# Patient Record
Sex: Female | Born: 1980 | Race: White | Hispanic: No | Marital: Single | Smoking: Never smoker
Health system: Southern US, Community
[De-identification: ages and names within clinical notes are randomized; demographics above are authoritative.]

## PROBLEM LIST (undated history)

## (undated) DIAGNOSIS — G43909 Migraine, unspecified, not intractable, without status migrainosus: Secondary | ICD-10-CM

## (undated) DIAGNOSIS — R5382 Chronic fatigue, unspecified: Secondary | ICD-10-CM

## (undated) DIAGNOSIS — C50919 Malignant neoplasm of unspecified site of unspecified female breast: Secondary | ICD-10-CM

## (undated) HISTORY — DX: Malignant neoplasm of unspecified site of unspecified female breast: C50.919

## (undated) HISTORY — DX: Chronic fatigue, unspecified: R53.82

## (undated) HISTORY — DX: Migraine, unspecified, not intractable, without status migrainosus: G43.909

---

## 2001-02-10 ENCOUNTER — Encounter: Admission: RE | Admit: 2001-02-10 | Discharge: 2001-02-10 | Payer: Self-pay | Admitting: Sports Medicine

## 2002-06-04 ENCOUNTER — Emergency Department (HOSPITAL_COMMUNITY): Admission: EM | Admit: 2002-06-04 | Discharge: 2002-06-04 | Payer: Self-pay | Admitting: Emergency Medicine

## 2002-12-01 ENCOUNTER — Encounter: Admission: RE | Admit: 2002-12-01 | Discharge: 2002-12-01 | Payer: Self-pay | Admitting: Sports Medicine

## 2003-05-13 ENCOUNTER — Encounter: Admission: RE | Admit: 2003-05-13 | Discharge: 2003-05-13 | Payer: Self-pay | Admitting: Obstetrics & Gynecology

## 2003-11-19 ENCOUNTER — Encounter: Admission: RE | Admit: 2003-11-19 | Discharge: 2003-11-19 | Payer: Self-pay | Admitting: Sports Medicine

## 2004-01-04 ENCOUNTER — Ambulatory Visit: Payer: Self-pay | Admitting: Internal Medicine

## 2006-08-20 ENCOUNTER — Other Ambulatory Visit: Admission: RE | Admit: 2006-08-20 | Discharge: 2006-08-20 | Payer: Self-pay | Admitting: Internal Medicine

## 2008-09-23 ENCOUNTER — Other Ambulatory Visit: Admission: RE | Admit: 2008-09-23 | Discharge: 2008-09-23 | Payer: Self-pay | Admitting: Internal Medicine

## 2009-02-05 HISTORY — PX: CLOSED REDUCTION CLAVICLE FRACTURE: SUR253

## 2009-05-18 ENCOUNTER — Emergency Department (HOSPITAL_COMMUNITY): Admission: EM | Admit: 2009-05-18 | Discharge: 2009-05-18 | Payer: Self-pay | Admitting: Emergency Medicine
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
## 2011-11-21 ENCOUNTER — Other Ambulatory Visit (HOSPITAL_COMMUNITY)
Admission: RE | Admit: 2011-11-21 | Discharge: 2011-11-21 | Disposition: A | Discharge status: Home / Self Care | Payer: BC Managed Care – PPO | Source: Ambulatory Visit | Attending: Internal Medicine | Admitting: Internal Medicine

## 2011-11-21 DIAGNOSIS — N760 Acute vaginitis: Secondary | ICD-10-CM | POA: Insufficient documentation

## 2011-11-21 DIAGNOSIS — Z1151 Encounter for screening for human papillomavirus (HPV): Secondary | ICD-10-CM | POA: Insufficient documentation

## 2011-11-21 DIAGNOSIS — Z01419 Encounter for gynecological examination (general) (routine) without abnormal findings: Secondary | ICD-10-CM | POA: Insufficient documentation

## 2013-01-19 ENCOUNTER — Ambulatory Visit (INDEPENDENT_AMBULATORY_CARE_PROVIDER_SITE_OTHER): Payer: BC Managed Care – PPO | Admitting: Physician Assistant

## 2013-01-19 ENCOUNTER — Encounter: Payer: Self-pay | Admitting: Physician Assistant

## 2013-01-19 ENCOUNTER — Other Ambulatory Visit: Payer: Self-pay | Admitting: Physician Assistant

## 2013-01-19 VITALS — BP 102/68 | HR 88 | Temp 100.0°F | Resp 16 | Ht 65.0 in | Wt 123.0 lb

## 2013-01-19 DIAGNOSIS — J019 Acute sinusitis, unspecified: Secondary | ICD-10-CM

## 2013-01-19 DIAGNOSIS — J301 Allergic rhinitis due to pollen: Secondary | ICD-10-CM

## 2013-01-19 MED ORDER — PREDNISONE 20 MG PO TABS: ORAL_TABLET | ORAL | Status: DC

## 2013-01-19 MED ORDER — AZITHROMYCIN 250 MG PO TABS: ORAL_TABLET | ORAL | Status: DC

## 2013-01-19 NOTE — Progress Notes (Signed)
   Subjective:    Patient ID: Ashlee Pena, female    DOB: Oct 14, 1980, 32 y.o.   MRN: [id number]  URI  This is a new problem. Episode onset: 2 weeks. The problem has been gradually worsening. The maximum temperature recorded prior to her arrival was 100 - 100.9 F. The fever has been present for less than 1 day. Associated symptoms include congestion, coughing, rhinorrhea and a sore throat. Pertinent negatives include no ear pain, sneezing or wheezing. She has tried antihistamine, decongestant, NSAIDs and sleep for the symptoms. The treatment provided mild relief.  Patient states she gets something like this twice yearly with change in the seasons, we have discussed allergies and allergy testing in the past.   Review of Systems  Constitutional: Positive for fever and fatigue. Negative for chills.  HENT: Positive for congestion, postnasal drip, rhinorrhea, sinus pressure and sore throat. Negative for ear discharge, ear pain, sneezing, tinnitus, trouble swallowing and voice change.   Eyes: Negative.   Respiratory: Positive for cough. Negative for shortness of breath, wheezing and stridor.   Cardiovascular: Negative.   Gastrointestinal: Negative.   Musculoskeletal: Negative.       Objective:   Physical Exam  Constitutional: She appears well-developed and well-nourished.  HENT:  Head: Normocephalic and atraumatic.  Right Ear: External ear normal.  Left Ear: External ear normal.  Nose: Right sinus exhibits frontal sinus tenderness. Left sinus exhibits frontal sinus tenderness.  Eyes: Conjunctivae and EOM are normal.  Neck: Normal range of motion. Neck supple.  Cardiovascular: Normal rate, regular rhythm, normal heart sounds and intact distal pulses.   Pulmonary/Chest: Effort normal and breath sounds normal. No respiratory distress. She has no wheezes.  Abdominal: Soft. Bowel sounds are normal.  Lymphadenopathy:    She has no cervical adenopathy.  Skin: Skin is warm and dry.       Assessment & Plan:  Acute sinusitis, unspecified - Plan: azithromycin (ZITHROMAX) 250 MG tablet, predniSONE (DELTASONE) 20 MG tablet Hold onto Zpak for now and Cold hand out given.  Allergic rhinitis due to pollen - Plan: Allergen food profile specific IgE [phone])

## 2013-01-19 NOTE — Patient Instructions (Signed)

## 2013-01-20 LAB — ALLERGEN FOOD PROFILE SPECIFIC IGE
Apple: <0.1 kU/L
Chicken IgE: <0.1 kU/L
Egg White IgE: <0.1 kU/L
Orange: <0.1 kU/L
Shrimp IgE: 0.64 kU/L — ABNORMAL HIGH
Tomato IgE: <0.1 kU/L
Wheat IgE: <0.1 kU/L

## 2013-01-21 LAB — ALLERGY PROFILE REGION II-DC, DE, MD, ~~LOC~~, VA
Allergen, D pternoyssinus,d7: 22.2 kU/L — ABNORMAL HIGH
Bermuda Grass: 0.52 kU/L — ABNORMAL HIGH
Box Elder IgE: <0.1 kU/L
Cladosporium Herbarum: <0.1 kU/L
Cockroach: 0.25 kU/L — ABNORMAL HIGH
Common Ragweed: 0.59 kU/L — ABNORMAL HIGH
IgE (Immunoglobulin E), Serum: 149.9 IU/mL (ref 0.0–180.0)
Johnson Grass: <0.1 kU/L
Lamb's Quarters: 0.11 kU/L — ABNORMAL HIGH
Meadow Grass: <0.1 kU/L
Pecan/Hickory Tree IgE: 1.54 kU/L — ABNORMAL HIGH

## 2013-12-07 ENCOUNTER — Ambulatory Visit (INDEPENDENT_AMBULATORY_CARE_PROVIDER_SITE_OTHER): Payer: BC Managed Care – PPO | Admitting: Physician Assistant

## 2013-12-07 ENCOUNTER — Encounter: Payer: Self-pay | Admitting: Physician Assistant

## 2013-12-07 VITALS — BP 110/78 | HR 68 | Temp 98.4°F | Resp 16 | Ht 65.0 in | Wt 126.0 lb

## 2013-12-07 DIAGNOSIS — Z0001 Encounter for general adult medical examination with abnormal findings: Secondary | ICD-10-CM

## 2013-12-07 DIAGNOSIS — R6889 Other general symptoms and signs: Secondary | ICD-10-CM

## 2013-12-07 DIAGNOSIS — Z79899 Other long term (current) drug therapy: Secondary | ICD-10-CM

## 2013-12-07 DIAGNOSIS — E559 Vitamin D deficiency, unspecified: Secondary | ICD-10-CM

## 2013-12-07 LAB — CBC WITH DIFFERENTIAL/PLATELET
Basophils Absolute: 0 10*3/uL (ref 0.0–0.1)
Basophils Relative: 0 % (ref 0–1)
EOS ABS: 0.1 10*3/uL (ref 0.0–0.7)
Eosinophils Relative: 1 % (ref 0–5)
HEMATOCRIT: 41.7 % (ref 36.0–46.0)
Hemoglobin: 14.7 g/dL (ref 12.0–15.0)
Lymphocytes Relative: 29 % (ref 12–46)
Lymphs Abs: 1.9 10*3/uL (ref 0.7–4.0)
MCH: 31.9 pg (ref 26.0–34.0)
MCHC: 35.3 g/dL (ref 30.0–36.0)
MCV: 90.5 fL (ref 78.0–100.0)
MONO ABS: 0.5 10*3/uL (ref 0.1–1.0)
MONOS PCT: 7 % (ref 3–12)
NEUTROS ABS: 4.2 10*3/uL (ref 1.7–7.7)
Neutrophils Relative %: 63 % (ref 43–77)
Platelets: 170 10*3/uL (ref 150–400)
RBC: 4.61 MIL/uL (ref 3.87–5.11)
RDW: 14.2 % (ref 11.5–15.5)
WBC: 6.6 10*3/uL (ref 4.0–10.5)

## 2013-12-07 MED ORDER — EPINEPHRINE 0.3 MG/0.3ML IJ SOAJ: 0.3000 mg | INTRAMUSCULAR | Status: DC | PRN

## 2013-12-07 NOTE — Patient Instructions (Signed)
Keeping You Healthy  Please get on 5000 IU vitamin D  Get These Tests 1. Blood Pressure- Have your blood pressure checked once a year by your health care provider.  Normal blood pressure is 120/80. 2. Weight- Have your body mass index (BMI) calculated to screen for obesity.  BMI is measure of body fat based on height and weight.  You can also calculate your own BMI at https://www.west-esparza.com/www.nhlbisupport.com/bmi/. 3. Cholesterol- Have your cholesterol checked every 5 years starting at age 33 then yearly starting at age 33. 4. Chlamydia, HIV, and other sexually transmitted diseases- Get screened every year until age 33, then within three months of each new sexual provider. 5. Pap Smear- Every 1-3 years; discuss with your health care provider. 6. Mammogram- Every year starting at age 33  Take these medicines  Calcium with Vitamin D-Your body needs 1200 mg of Calcium each day and [phone] IU of Vitamin D daily.  Your body can only absorb 500 mg of Calcium at a time so Calcium must be taken in 2 or 3 divided doses throughout the day.  Multivitamin with folic acid- Once daily if it is possible for you to become pregnant.  Get these Immunizations  Gardasil-Series of three doses; prevents HPV related illness such as genital warts and cervical cancer.  Menactra-Single dose; prevents meningitis.  Tetanus shot- Every 10 years.  Flu shot-Every year.  Take these steps 1. Do not smoke-Your healthcare provider can help you quit.  For tips on how to quit go to www.smokefree.gov or call 1-800 QUITNOW. 2. Be physically active- Exercise 5 days a week for at least 30 minutes.  If you are not already physically active, start slow and gradually work up to 30 minutes of moderate physical activity.  Examples of moderate activity include walking briskly, dancing, swimming, bicycling, etc. 3. Breast Cancer- A self breast exam every month is important for early detection of breast cancer.  For more information and instruction on  self breast exams, ask your healthcare provider or [place].eswww.womenshealth.gov/faq/breast-self-exam.cfm. 4. Eat a healthy diet- Eat a variety of healthy foods such as fruits, vegetables, whole grains, low fat milk, low fat cheeses, yogurt, lean meats, poultry and fish, beans, nuts, tofu, etc.  For more information go to www. Thenutritionsource.org 5. Drink alcohol in moderation- Limit alcohol intake to one drink or less per day. Never drink and drive. 6. Depression- Your emotional health is as important as your physical health.  If you're feeling down or losing interest in things you normally enjoy please talk to your healthcare provider about being screened for depression. 7. Dental visit- Brush and floss your teeth twice daily; visit your dentist twice a year. 8. Eye doctor- Get an eye exam at least every 2 years. 9. Helmet use- Always wear a helmet when riding a bicycle, motorcycle, rollerblading or skateboarding. 10. Safe sex- If you may be exposed to sexually transmitted infections, use a condom. 11. Seat belts- Seat belts can save your live; always wear one. 12. Smoke/Carbon Monoxide detectors- These detectors need to be installed on the appropriate level of your home. Replace batteries at least once a year. 13. Skin cancer- When out in the sun please cover up and use sunscreen 15 SPF or higher. 14. Violence- If anyone is threatening or hurting you, please tell your healthcare provider.

## 2013-12-07 NOTE — Addendum Note (Signed)
Addended by: Quentin MullingOLLIER, Romelo Sciandra R on: 12/07/2013 03:38 PM   Modules accepted: Kipp BroodSmartSet

## 2013-12-07 NOTE — Progress Notes (Signed)
Complete Physical  Assessment and Plan: Allergies- Epipen Myalgias- check labs Vitamin D def- get on 5000 IU daily.  Health Maintenance  Discussed med's effects and SE's. Screening labs and tests as requested with regular follow-up as recommended.  HPI  33 y.o. female  presents for a complete physical. Her blood pressure has been controlled at home, today their BP is BP: 110/78 mmHg She does workout. She denies chest pain, shortness of breath, dizziness.  She is not on cholesterol medication and denies myalgias. Her cholesterol is at goal. The cholesterol last visit was:   Last A1C in the office was:  Patient is not on Vitamin D supplement, Vit D 36.   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 She is also working at [employer], broke up with boyfriend too. She has been very tired but states it is due to activity, started on magnesium which she thinks has helped. Last Mag 1.9 She is not on Nuvaring but not sexually active.  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   Current Medications:    Medication List       This list is accurate as of: 12/07/13  2:23 PM.  Always use your most recent med list.               ADVIL 200 MG Caps  Generic drug:  Ibuprofen  Take 200 mg by mouth as needed.     EPIPEN 2-PAK 0.3 mg/0.3 mL Soaj injection  Generic drug:  EPINEPHrine  Inject 0.3 mg into the muscle as needed.     etonogestrel-ethinyl estradiol 0.12-0.015 MG/24HR vaginal ring  Commonly known as:  NUVARING  Place 1 each vaginally every 28 (twenty-eight) days. Insert vaginally and leave in place for 3 consecutive weeks, then remove for 1 week.     ZYRTEC ALLERGY PO  Take by mouth as needed.       Health Maintenance:   Immunization History  Administered Date(s) Administered  . HPV Quadrivalent 07/19/2005, 10/09/2005, 01/17/2006  . Tdap 09/17/2007   Tetanus:2009 Pneumovax: Flu vaccine:  declines Gardasil X 3 last one 2007 Zostavax: LMP: Oct 20th 2015, last 9 days, spotting in the beginning and then heavy Pap: normal PAP 2013 due 2016 MGM:  DEXA: Colonoscopy: EGD:  Patient Care Team: Lucky CowboyWilliam McKeown, MD as PCP - General (Internal Medicine)  Allergies: No Known Allergies Medical History: No past medical history on file. Surgical History:  Past Surgical History  Procedure Laterality Date  . Closed reduction clavicle fracture Right 2011   Family History:  Family History  Problem Relation Age of Onset  . Cancer Father     skin  . Hypertension Other   . Stroke Other   . Cancer Other     skin  . Heart attack Other    Social History:  History  Substance Use Topics  . Smoking status: Never Smoker   . Smokeless tobacco: Never Used  . Alcohol Use: Yes     Comment: Occasionally   Review of Systems: [X]  = complains of  [ ]  = denies  General: Fatigue [ ]  Fever [ ]  Chills [ ]  Weakness [ ]   Insomnia [ ] Weight change [ ]  Night sweats [ ]   Change in appetite [ ]  Head: Head Trauma [ ]  Eyes: Wears glasses or corrective lens [ ]  Redness [ ]  Blurred vision [ ]  Diplopia [ ]  Discharge [ ]  Floaters [ ]   QIO:NGEXBMWENT:Earache [ ]  hearing loss [ ]  Tinnitus [ ]  Ear Discharge [ ]   Congestion [ ]  Sinus Pain [ ]  Post Nasal Drip [ ]  Nose Bleeds [ ]  Rhinorrhea [ ]    Difficulty Swallowing [ ]  Snoring [ ]  Sore Throat [ ]  Cardiac:   Chest pain/pressure [ ]  SOB [ ]  Orthopnea [ ]   Palpitations [ ]   Paroxysmal nocturnal dyspnea[ ]  Claudication [ ]  Edema [ ]  Difficulty walking around block or climbing stairs [ ]  Pulmonary: Cough [ ]  Wheezing[ ]   SOB [ ]   Pleurisy [ ]  Asthma [ ]  GI: Nausea [ ]  Vomiting[ ]  Dysphagia[ ]  Heartburn[ ]  Abdominal pain [ ]  Constipation [ ] ; Diarrhea [ ]  BRBPR [ ]  Melena[ ]  Bloating [ ]  Hemorrhoids [ ]  Incontinence [ ]  GU: Hematuria[ ]  Dysuria [ ]  Nocturia[ ]  Urgency [ ]   Hesitancy [ ]  Discharge [ ]  Frequency [ ]  Incontinence [ ]  Breast:  Dimpling [ ]  Breast lumps [ ]    Breast Lesions [ ]  Nipple discharge [ ]    Neuro: Headaches[ ]  Vertigo[ ]  Paresthesias[ ]  Spasm [ ]  Speech changes [ ]  Incoordination [ ]  Dizziness [ ]  Numbness [ ]  Ortho: Arthritis [ ]  Joint pain [ ]  Muscle pain [ ]  Joint swelling [ ]  Back Pain [ ]  Weakness [ ]  Stiffness [ ]  Skin:  Rash [ ]   Pruritis [ ]  Change in skin lesion [ ]  Change in hair [ ]  Change in nails [ ]  Psych: Depression[ ]  Anxiety[ ]  Stress [ ]  Confusion [ ]  Memory loss [ ]   Heme/Lymph: Bleeding [ ]  Bruising [ ]  History of anemia [ ]  Enlarged lymph nodes [ ]   Endocrine: Visual blurring [ ]  Paresthesia [ ]  Polyuria [ ]  Polydipsia [ ]  Polyphagia [ ]   Heat/cold intolerance [ ]  Hypoglycemia [ ]  Thyroid Issues [ ]  Diabetes [ ]   Physical Exam: Estimated body mass index is 20.97 kg/(m^2) as calculated from the following:   Height as of this encounter: 5\' 5"  (1.651 m).   Weight as of this encounter: 126 lb (57.153 kg). BP 110/78 mmHg  Pulse 68  Temp(Src) 98.4 F (36.9 C)  Resp 16  Ht 5\' 5"  (1.651 m)  Wt 126 lb (57.153 kg)  BMI 20.97 kg/m2 General Appearance: Well nourished, in no apparent distress.  Eyes: PERRLA, EOMs, conjunctiva no swelling or erythema, normal fundi and vessels.  Sinuses: No Frontal/maxillary tenderness  ENT/Mouth: Ext aud canals clear, normal light reflex with TMs without erythema, bulging. Good dentition. No erythema, swelling, or exudate on post pharynx. Tonsils not swollen or erythematous. Hearing normal.  Neck: Supple, thyroid normal. No bruits  Respiratory: Respiratory effort normal, BS equal bilaterally without rales, rhonchi, wheezing or stridor.  Cardio: RRR without murmurs, rubs or gallops. Brisk peripheral pulses without edema.  Chest: symmetric, with normal excursions and percussion.  Breasts: Symmetric, without lumps, nipple discharge, retractions.  Abdomen: Soft, nontender, no guarding, rebound, hernias, masses, or organomegaly. .  Lymphatics: Non tender without lymphadenopathy.  Genitourinary:   Musculoskeletal: Full ROM all peripheral extremities,5/5 strength, and normal gait.  Skin: Warm, dry without rashes, lesions, ecchymosis. Neuro: Cranial nerves intact, reflexes equal bilaterally. Normal muscle tone, no cerebellar symptoms. Sensation intact.  Psych: Awake and oriented X 3, normal affect, Insight and Judgment appropriate.   EKG: defer   Quentin MullingCollier, Amanda 2:13 PM [facility]

## 2013-12-08 LAB — IRON AND TIBC
%SAT: 58 % — ABNORMAL HIGH (ref 20–55)
Iron: 162 ug/dL — ABNORMAL HIGH (ref 42–145)
TIBC: 281 ug/dL (ref 250–470)
UIBC: 119 ug/dL — AB (ref 125–400)

## 2013-12-08 LAB — BASIC METABOLIC PANEL WITH GFR
BUN: 8 mg/dL (ref 6–23)
CO2: 27 mEq/L (ref 19–32)
Calcium: 9.2 mg/dL (ref 8.4–10.5)
Chloride: 104 mEq/L (ref 96–112)
Creat: 0.67 mg/dL (ref 0.50–1.10)
GFR, Est African American: >89 mL/min
GFR, Est Non African American: >89 mL/min
Glucose, Bld: 75 mg/dL (ref 70–99)
POTASSIUM: 3.7 meq/L (ref 3.5–5.3)
Sodium: 140 mEq/L (ref 135–145)

## 2013-12-08 LAB — HEPATIC FUNCTION PANEL
ALT: 10 U/L (ref 0–35)
AST: 16 U/L (ref 0–37)
Albumin: 4.8 g/dL (ref 3.5–5.2)
Alkaline Phosphatase: 50 U/L (ref 39–117)
BILIRUBIN INDIRECT: 0.4 mg/dL (ref 0.2–1.2)
Bilirubin, Direct: 0.1 mg/dL (ref 0.0–0.3)
Total Bilirubin: 0.5 mg/dL (ref 0.2–1.2)
Total Protein: 6.7 g/dL (ref 6.0–8.3)

## 2013-12-08 LAB — URINALYSIS, MICROSCOPIC ONLY
BACTERIA UA: NONE SEEN
CASTS: NONE SEEN
Crystals: NONE SEEN
Squamous Epithelial / LPF: NONE SEEN

## 2013-12-08 LAB — HEMOGLOBIN A1C
HEMOGLOBIN A1C: 5.5 % (ref <5.7)
Mean Plasma Glucose: 111 mg/dL (ref <117)

## 2013-12-08 LAB — URINALYSIS, ROUTINE W REFLEX MICROSCOPIC
Bilirubin Urine: NEGATIVE
Glucose, UA: NEGATIVE mg/dL
Hgb urine dipstick: NEGATIVE
Nitrite: NEGATIVE
Protein, ur: NEGATIVE mg/dL
Specific Gravity, Urine: <1.005 — ABNORMAL LOW (ref 1.005–1.030)
Urobilinogen, UA: 0.2 mg/dL (ref 0.0–1.0)
pH: 6 (ref 5.0–8.0)

## 2013-12-08 LAB — LIPID PANEL
CHOLESTEROL: 135 mg/dL (ref 0–200)
HDL: 63 mg/dL (ref >39)
LDL Cholesterol: 60 mg/dL (ref 0–99)
Total CHOL/HDL Ratio: 2.1 Ratio
Triglycerides: 60 mg/dL (ref <150)
VLDL: 12 mg/dL (ref 0–40)

## 2013-12-08 LAB — FERRITIN: Ferritin: 36 ng/mL (ref 10–291)

## 2013-12-08 LAB — MICROALBUMIN / CREATININE URINE RATIO
Creatinine, Urine: 28.1 mg/dL
Microalb Creat Ratio: 14.2 mg/g (ref 0.0–30.0)
Microalb, Ur: 0.4 mg/dL (ref <2.0)

## 2013-12-08 LAB — MAGNESIUM: MAGNESIUM: 1.9 mg/dL (ref 1.5–2.5)

## 2013-12-08 LAB — INSULIN, FASTING: Insulin fasting, serum: 9.1 u[IU]/mL (ref 2.0–19.6)

## 2013-12-08 LAB — VITAMIN B12: VITAMIN B 12: 379 pg/mL (ref 211–911)

## 2013-12-08 LAB — TSH: TSH: 1.466 u[IU]/mL (ref 0.350–4.500)

## 2013-12-08 LAB — VITAMIN D 25 HYDROXY (VIT D DEFICIENCY, FRACTURES): VIT D 25 HYDROXY: 27 ng/mL — AB (ref 30–89)

## 2014-05-12 ENCOUNTER — Ambulatory Visit (INDEPENDENT_AMBULATORY_CARE_PROVIDER_SITE_OTHER): Payer: Managed Care, Other (non HMO) | Admitting: Internal Medicine

## 2014-05-12 ENCOUNTER — Encounter: Payer: Self-pay | Admitting: Internal Medicine

## 2014-05-12 VITALS — BP 104/78 | HR 64 | Temp 98.6°F | Resp 18 | Ht 65.0 in | Wt 123.0 lb

## 2014-05-12 DIAGNOSIS — Z113 Encounter for screening for infections with a predominantly sexual mode of transmission: Secondary | ICD-10-CM

## 2014-05-12 DIAGNOSIS — R300 Dysuria: Secondary | ICD-10-CM

## 2014-05-12 MED ORDER — CEPHALEXIN 500 MG PO CAPS: 500.0000 mg | ORAL_CAPSULE | Freq: Three times a day (TID) | ORAL | Status: AC

## 2014-05-12 MED ORDER — PHENAZOPYRIDINE HCL 97.2 MG PO TABS: 97.0000 mg | ORAL_TABLET | Freq: Three times a day (TID) | ORAL | Status: DC | PRN

## 2014-05-12 NOTE — Patient Instructions (Signed)

## 2014-05-12 NOTE — Progress Notes (Signed)
Subjective:    Patient ID: Ashlee MorasAlisha Speir, female    DOB: 10-05-1980, 34 y.o.   MRN: [id number]  Dysuria  Associated symptoms include frequency and urgency. Pertinent negatives include no chills, hematuria, nausea or vomiting.   Patient presents to the office for evaluation of dysuria.  She reports that for the past 2 weeks she has had urinary frequency and dysuria since last Tuesday.  She reports no vaginal discharge.  She reports that she did have unprotected sex 2 weeks ago and she is concerned that she may have been exposed to something.  She reports that she just finished her period.  Period was normal.  She has no history of STD in the past.  She is not on birth control at this time.  She has no history of frequent UTI in the past.     Review of Systems  Constitutional: Negative for fever, chills and fatigue.  Gastrointestinal: Negative for nausea, vomiting, abdominal pain, diarrhea and constipation.  Genitourinary: Positive for dysuria, urgency and frequency. Negative for hematuria, vaginal bleeding, vaginal discharge and difficulty urinating.       Objective:   Physical Exam  Constitutional: She is oriented to person, place, and time. She appears well-developed and well-nourished. No distress.  HENT:  Head: Normocephalic and atraumatic.  Mouth/Throat: Oropharynx is clear and moist. No oropharyngeal exudate.  Eyes: Conjunctivae are normal. Pupils are equal, round, and reactive to light.  Neck: Normal range of motion. Neck supple. No JVD present. No thyromegaly present.  Cardiovascular: Normal rate, regular rhythm, normal heart sounds and intact distal pulses.  Exam reveals no gallop and no friction rub.   No murmur heard. Pulmonary/Chest: Effort normal and breath sounds normal. No respiratory distress. She has no wheezes. She has no rales. She exhibits no tenderness.  Abdominal: Soft. Bowel sounds are normal. She exhibits no distension and no mass. There is no tenderness.  There is no rebound and no guarding.  Genitourinary: No labial fusion. There is no rash, tenderness, lesion or injury on the right labia. There is no rash, tenderness, lesion or injury on the left labia. Cervix exhibits no motion tenderness, no discharge and no friability. Right adnexum displays no mass, no tenderness and no fullness. Left adnexum displays no mass, no tenderness and no fullness. No erythema, tenderness or bleeding in the vagina. No foreign body around the vagina. No signs of injury around the vagina. No vaginal discharge found.  Musculoskeletal: Normal range of motion.  Lymphadenopathy:    She has no cervical adenopathy.  Neurological: She is alert and oriented to person, place, and time.  Skin: Skin is warm and dry. She is not diaphoretic.  Psychiatric: She has a normal mood and affect. Her behavior is normal. Judgment and thought content normal.  Nursing note and vitals reviewed.         Assessment & Plan:  No CMT doubt PID.  Will wait for GC culture to determine treatment for STD.    1. Screening for STD (sexually transmitted disease)  - RPR - HIV antibody - WET PREP BY MOLECULAR PROBE - HSV(herpes simplex vrs) 1+2 ab-IgG - GC/Chlamydia Probe Amp  2. Dysuria  - Urinalysis, Routine w reflex microscopic - Urine culture - cephALEXin (KEFLEX) 500 MG capsule; Take 1 capsule (500 mg total) by mouth 3 (three) times daily.  Dispense: 21 capsule; Refill: 0 - phenazopyridine (PYRIDIUM) 97 MG tablet; Take 1 tablet (97 mg total) by mouth 3 (three) times daily as needed for pain.  Dispense: 20 tablet; Refill: 0

## 2014-05-13 ENCOUNTER — Ambulatory Visit: Payer: Self-pay | Admitting: Internal Medicine

## 2014-05-13 LAB — WET PREP BY MOLECULAR PROBE
Candida species: NEGATIVE
Gardnerella vaginalis: NEGATIVE
Trichomonas vaginosis: NEGATIVE

## 2014-05-13 LAB — URINALYSIS, ROUTINE W REFLEX MICROSCOPIC
BILIRUBIN URINE: NEGATIVE
Glucose, UA: NEGATIVE mg/dL
HGB URINE DIPSTICK: NEGATIVE
KETONES UR: NEGATIVE mg/dL
Nitrite: NEGATIVE
Protein, ur: NEGATIVE mg/dL
SPECIFIC GRAVITY, URINE: 1.009 (ref 1.005–1.030)
UROBILINOGEN UA: 0.2 mg/dL (ref 0.0–1.0)
pH: 7 (ref 5.0–8.0)

## 2014-05-13 LAB — URINALYSIS, MICROSCOPIC ONLY
Casts: NONE SEEN
Crystals: NONE SEEN
Squamous Epithelial / LPF: NONE SEEN

## 2014-05-13 LAB — RPR

## 2014-05-13 LAB — GC/CHLAMYDIA PROBE AMP

## 2014-05-13 LAB — HSV(HERPES SIMPLEX VRS) I + II AB-IGG: HSV 1 Glycoprotein G Ab, IgG: 7.03 IV — ABNORMAL HIGH

## 2014-05-13 LAB — HIV ANTIBODY (ROUTINE TESTING W REFLEX): HIV 1&2 Ab, 4th Generation: NONREACTIVE

## 2014-05-15 ENCOUNTER — Other Ambulatory Visit: Payer: Self-pay | Admitting: Internal Medicine

## 2014-05-15 LAB — URINE CULTURE: Colony Count: 50000

## 2014-05-15 MED ORDER — SULFAMETHOXAZOLE-TRIMETHOPRIM 800-160 MG PO TABS: 1.0000 | ORAL_TABLET | Freq: Two times a day (BID) | ORAL | Status: AC

## 2014-07-01 ENCOUNTER — Other Ambulatory Visit: Payer: Self-pay

## 2014-07-01 MED ORDER — ETONOGESTREL-ETHINYL ESTRADIOL 0.12-0.015 MG/24HR VA RING: VAGINAL_RING | VAGINAL | Status: DC

## 2014-12-16 ENCOUNTER — Encounter: Payer: Self-pay | Admitting: Physician Assistant

## 2014-12-16 ENCOUNTER — Ambulatory Visit (INDEPENDENT_AMBULATORY_CARE_PROVIDER_SITE_OTHER): Payer: Managed Care, Other (non HMO) | Admitting: Physician Assistant

## 2014-12-16 VITALS — BP 110/70 | HR 71 | Temp 98.1°F | Resp 16 | Ht 65.0 in | Wt 125.0 lb

## 2014-12-16 DIAGNOSIS — E559 Vitamin D deficiency, unspecified: Secondary | ICD-10-CM | POA: Diagnosis not present

## 2014-12-16 DIAGNOSIS — Z124 Encounter for screening for malignant neoplasm of cervix: Secondary | ICD-10-CM

## 2014-12-16 DIAGNOSIS — Z0000 Encounter for general adult medical examination without abnormal findings: Secondary | ICD-10-CM | POA: Diagnosis not present

## 2014-12-16 DIAGNOSIS — Z139 Encounter for screening, unspecified: Secondary | ICD-10-CM

## 2014-12-16 DIAGNOSIS — Z13220 Encounter for screening for lipoid disorders: Secondary | ICD-10-CM

## 2014-12-16 DIAGNOSIS — Z131 Encounter for screening for diabetes mellitus: Secondary | ICD-10-CM

## 2014-12-16 DIAGNOSIS — Z1389 Encounter for screening for other disorder: Secondary | ICD-10-CM

## 2014-12-16 DIAGNOSIS — Z79899 Other long term (current) drug therapy: Secondary | ICD-10-CM | POA: Diagnosis not present

## 2014-12-16 DIAGNOSIS — R5383 Other fatigue: Secondary | ICD-10-CM | POA: Diagnosis not present

## 2014-12-16 LAB — BASIC METABOLIC PANEL WITH GFR
BUN: 12 mg/dL (ref 7–25)
CO2: 25 mmol/L (ref 20–31)
Calcium: 9.7 mg/dL (ref 8.6–10.2)
Chloride: 102 mmol/L (ref 98–110)
Creat: 0.64 mg/dL (ref 0.50–1.10)
GFR, Est African American: >89 mL/min (ref >=60)
GFR, Est Non African American: >89 mL/min (ref >=60)
Glucose, Bld: 79 mg/dL (ref 65–99)
POTASSIUM: 3.9 mmol/L (ref 3.5–5.3)
SODIUM: 138 mmol/L (ref 135–146)

## 2014-12-16 LAB — LIPID PANEL
Cholesterol: 168 mg/dL (ref 125–200)
HDL: 79 mg/dL (ref >=46)
LDL CALC: 74 mg/dL (ref <130)
TRIGLYCERIDES: 73 mg/dL (ref <150)
Total CHOL/HDL Ratio: 2.1 Ratio (ref <=5.0)
VLDL: 15 mg/dL (ref <30)

## 2014-12-16 LAB — CBC WITH DIFFERENTIAL/PLATELET
BASOS ABS: 0 10*3/uL (ref 0.0–0.1)
Basophils Relative: 0 % (ref 0–1)
EOS PCT: 1 % (ref 0–5)
Eosinophils Absolute: 0.1 10*3/uL (ref 0.0–0.7)
HCT: 44.4 % (ref 36.0–46.0)
Hemoglobin: 15.2 g/dL — ABNORMAL HIGH (ref 12.0–15.0)
Lymphocytes Relative: 34 % (ref 12–46)
Lymphs Abs: 2.1 10*3/uL (ref 0.7–4.0)
MCH: 32.3 pg (ref 26.0–34.0)
MCHC: 34.2 g/dL (ref 30.0–36.0)
MCV: 94.5 fL (ref 78.0–100.0)
MPV: 11.4 fL (ref 8.6–12.4)
Monocytes Absolute: 0.4 10*3/uL (ref 0.1–1.0)
Monocytes Relative: 6 % (ref 3–12)
Neutro Abs: 3.6 10*3/uL (ref 1.7–7.7)
Neutrophils Relative %: 59 % (ref 43–77)
PLATELETS: 163 10*3/uL (ref 150–400)
RBC: 4.7 MIL/uL (ref 3.87–5.11)
RDW: 14.1 % (ref 11.5–15.5)
WBC: 6.1 10*3/uL (ref 4.0–10.5)

## 2014-12-16 LAB — IRON AND TIBC
%SAT: 24 % (ref 11–50)
Iron: 93 ug/dL (ref 40–190)
TIBC: 388 ug/dL (ref 250–450)
UIBC: 295 ug/dL (ref 125–400)

## 2014-12-16 LAB — HEPATIC FUNCTION PANEL
ALK PHOS: 53 U/L (ref 33–115)
ALT: 10 U/L (ref 6–29)
AST: 16 U/L (ref 10–30)
Albumin: 5 g/dL (ref 3.6–5.1)
BILIRUBIN TOTAL: 0.4 mg/dL (ref 0.2–1.2)
Bilirubin, Direct: 0.1 mg/dL (ref <=0.2)
Indirect Bilirubin: 0.3 mg/dL (ref 0.2–1.2)
Total Protein: 7.6 g/dL (ref 6.1–8.1)

## 2014-12-16 LAB — FERRITIN: Ferritin: 34 ng/mL (ref 10–291)

## 2014-12-16 LAB — VITAMIN B12: Vitamin B-12: 322 pg/mL (ref 211–911)

## 2014-12-16 LAB — TSH: TSH: 1.07 u[IU]/mL (ref 0.350–4.500)

## 2014-12-16 LAB — MAGNESIUM: MAGNESIUM: 2.2 mg/dL (ref 1.5–2.5)

## 2014-12-16 MED ORDER — VITAMIN D (ERGOCALCIFEROL) 1.25 MG (50000 UNIT) PO CAPS: ORAL_CAPSULE | ORAL | Status: DC

## 2014-12-16 NOTE — Progress Notes (Signed)
Complete Physical  Assessment and Plan: 1. Routine general medical examination at a health care facility - Vitamin D, Ergocalciferol, (DRISDOL) 50000 UNITS CAPS capsule; 1-2 times a week for deficiency.  Dispense: 30 capsule; Refill: 1 - CBC with Differential/Platelet - BASIC METABOLIC PANEL WITH GFR - Hepatic function panel - TSH - Lipid panel - Magnesium - Microalbumin / creatinine urine ratio - Urinalysis, Routine w reflex microscopic (not at [facility]) - Vit D  25 hydroxy (rtn osteoporosis monitoring) - Iron and TIBC - Ferritin - Vitamin B12  2. Screening for cervical cancer Will come back for PAP  3. Screening cholesterol level - Lipid panel  4. Screening for diabetes mellitus Low risk, no screening at this time.   5. Vitamin D deficiency - Vitamin D, Ergocalciferol, (DRISDOL) 50000 UNITS CAPS capsule; 1-2 times a week for deficiency.  Dispense: 30 capsule; Refill: 1 - Vit D  25 hydroxy (rtn osteoporosis monitoring)  6. Screening for blood or protein in urine - Microalbumin / creatinine urine ratio - Urinalysis, Routine w reflex microscopic (not at [facility])  7. Other fatigue - Iron and TIBC - Ferritin - Vitamin B12  8. Medication management - CBC with Differential/Platelet - BASIC METABOLIC PANEL WITH GFR - Hepatic function panel - TSH - Magnesium   Discussed med's effects and SE's. Screening labs and tests as requested with regular follow-up as recommended.  HPI  34 y.o. female  presents for a complete physical. Her blood pressure has been controlled at home, today their BP is BP: 110/70 mmHg She does workout. She denies chest pain, shortness of breath, dizziness.  She is not on cholesterol medication and denies myalgias. Her cholesterol is at goal. 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  In jan switched from [employer] to [employer], now working from home but not happy with it.  She is not on Nuvaring but not sexually active.  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   Current Medications:    Medication List       This list is accurate as of: 12/16/14  1:58 PM.  Always use your most recent med list.               ADVIL 200 MG Caps  Generic drug:  Ibuprofen  Take 200 mg by mouth as needed.     EPINEPHrine 0.3 mg/0.3 mL Soaj injection  Commonly known as:  EPIPEN 2-PAK  Inject 0.3 mLs (0.3 mg total) into the muscle as needed.     etonogestrel-ethinyl estradiol 0.12-0.015 MG/24HR vaginal ring  Commonly known as:  NUVARING  Insert vaginally and leave in place for 3 consecutive weeks, then remove for 1 week.     phenazopyridine 97 MG tablet  Commonly known as:  PYRIDIUM  Take 1 tablet (97 mg total) by mouth 3 (three) times daily as  needed for pain.     VITAMIN D PO  Take 5,000 Units by mouth.     ZYRTEC ALLERGY PO  Take by mouth as needed.       Health Maintenance:   Immunization History  Administered Date(s) Administered  . HPV Quadrivalent 07/19/2005, 10/09/2005, 01/17/2006  . Tdap 09/17/2007   Tetanus:2009 Pneumovax: Flu vaccine: declines Gardasil X 3 last one 2007 Zostavax: LMP: Yesterday Pap: normal PAP 2013 due 2017, will wait until next year MGM:  DEXA: Colonoscopy: EGD: CT head 2011  Patient Care Team: Lucky CowboyWilliam McKeown, MD as PCP - General (Internal Medicine)  Allergies: No Known Allergies Medical History: No past medical history on file. Surgical History:  Past  Surgical History  Procedure Laterality Date  . Closed reduction clavicle fracture Right 2011   Family History:  Family History  Problem Relation Age of Onset  . Cancer Father     skin  . Hypertension Other   . Stroke Other   . Cancer Other     skin  . Heart attack Other    Social History:  Social History  Substance Use Topics  . Smoking status: Never Smoker   . Smokeless tobacco: Never Used  . Alcohol Use: Yes     Comment: Occasionally   Review of Systems  Constitutional: Positive for malaise/fatigue. Negative for fever, chills, weight loss and diaphoresis.  HENT: Positive for congestion. Negative for ear discharge, ear pain, hearing loss, nosebleeds, sore throat and tinnitus.        Snoring  Respiratory: Negative.  Negative for stridor.   Cardiovascular: Negative.   Gastrointestinal: Negative.   Genitourinary: Negative.        Nocturia x 3  Musculoskeletal: Negative.   Skin: Negative.   Neurological: Negative.  Negative for weakness and headaches.  Psychiatric/Behavioral: Positive for depression. Negative for suicidal ideas, hallucinations, memory loss and substance abuse. The patient is not nervous/anxious and does not have insomnia.      Physical Exam: Estimated body mass index is 20.8 kg/(m^2) as calculated from the following:   Height as of this encounter: 5\' 5"  (1.651 m).   Weight as of this encounter: 125 lb (56.7 kg). BP 110/70 mmHg  Pulse 71  Temp(Src) 98.1 F (36.7 C) (Temporal)  Resp 16  Ht 5\' 5"  (1.651 m)  Wt 125 lb (56.7 kg)  BMI 20.80 kg/m2  SpO2 99%  LMP 12/15/2014 General Appearance: Well nourished, in no apparent distress.  Eyes: PERRLA, EOMs, conjunctiva no swelling or erythema, normal fundi and vessels.  Sinuses: No Frontal/maxillary tenderness  ENT/Mouth: Ext aud canals clear, normal light reflex with TMs without erythema, bulging. Good dentition. No erythema, swelling, or exudate on post pharynx. Tonsils not swollen or erythematous. Hearing  normal.  Neck: Supple, thyroid normal. No bruits  Respiratory: Respiratory effort normal, BS equal bilaterally without rales, rhonchi, wheezing or stridor.  Cardio: RRR without murmurs, rubs or gallops. Brisk peripheral pulses without edema.  Chest: symmetric, with normal excursions and percussion.  Breasts: Symmetric, without lumps, nipple discharge, retractions.  Abdomen: Soft, nontender, no guarding, rebound, hernias, masses, or organomegaly. .  Lymphatics: Non tender without lymphadenopathy.  Genitourinary: defer next year Musculoskeletal: Full ROM all peripheral extremities,5/5 strength, and normal gait.  Skin: Warm, dry without rashes, lesions, ecchymosis. Neuro: Cranial nerves intact, reflexes equal bilaterally. Normal muscle tone, no cerebellar symptoms. Sensation intact.  Psych: Awake and oriented X 3, normal affect, Insight and Judgment appropriate.   EKG: defer   Quentin Mullingmanda Emalina Dubreuil 1:58 PM [facility]

## 2014-12-16 NOTE — Patient Instructions (Addendum)
Can try melatonin 5mg -15 mg at night for sleep If this does not help we can try prescription medication.  Also here is some information about good sleep hygiene.   Get on vitamin D 1-2 times a week  Come back for PAP and skin exam.   Insomnia Insomnia is frequent trouble falling and/or staying asleep. Insomnia can be a long term problem or a short term problem. Both are common. Insomnia can be a short term problem when the wakefulness is related to a certain stress or worry. Long term insomnia is often related to ongoing stress during waking hours and/or poor sleeping habits. Overtime, sleep deprivation itself can make the problem worse. Every little thing feels more severe because you are overtired and your ability to cope is decreased. CAUSES   Stress, anxiety, and depression.  Poor sleeping habits.  Distractions such as TV in the bedroom.  Naps close to bedtime.  Engaging in emotionally charged conversations before bed.  Technical reading before sleep.  Alcohol and other sedatives. They may make the problem worse. They can hurt normal sleep patterns and normal dream activity.  Stimulants such as caffeine for several hours prior to bedtime.  Pain syndromes and shortness of breath can cause insomnia.  Exercise late at night.  Changing time zones may cause sleeping problems (jet lag). It is sometimes helpful to have someone observe your sleeping patterns. They should look for periods of not breathing during the night (sleep apnea). They should also look to see how long those periods last. If you live alone or observers are uncertain, you can also be observed at a sleep clinic where your sleep patterns will be professionally monitored. Sleep apnea requires a checkup and treatment. Give your caregivers your medical history. Give your caregivers observations your family has made about your sleep.  SYMPTOMS   Not feeling rested in the morning.  Anxiety and restlessness at  bedtime.  Difficulty falling and staying asleep. TREATMENT   Your caregiver may prescribe treatment for an underlying medical disorders. Your caregiver can give advice or help if you are using alcohol or other drugs for self-medication. Treatment of underlying problems will usually eliminate insomnia problems.  Medications can be prescribed for short time use. They are generally not recommended for lengthy use.  Over-the-counter sleep medicines are not recommended for lengthy use. They can be habit forming.  You can promote easier sleeping by making lifestyle changes such as:  Using relaxation techniques that help with breathing and reduce muscle tension.  Exercising earlier in the day.  Changing your diet and the time of your last meal. No night time snacks.  Establish a regular time to go to bed.  Counseling can help with stressful problems and worry.  Soothing music and white noise may be helpful if there are background noises you cannot remove.  Stop tedious detailed work at least one hour before bedtime. HOME CARE INSTRUCTIONS   Keep a diary. Inform your caregiver about your progress. This includes any medication side effects. See your caregiver regularly. Take note of:  Times when you are asleep.  Times when you are awake during the night.  The quality of your sleep.  How you feel the next day. This information will help your caregiver care for you.  Get out of bed if you are still awake after 15 minutes. Read or do some quiet activity. Keep the lights down. Wait until you feel sleepy and go back to bed.  Keep regular sleeping and waking hours.  Avoid naps.  Exercise regularly.  Avoid distractions at bedtime. Distractions include watching television or engaging in any intense or detailed activity like attempting to balance the household checkbook.  Develop a bedtime ritual. Keep a familiar routine of bathing, brushing your teeth, climbing into bed at the same time  each night, listening to soothing music. Routines increase the success of falling to sleep faster.  Use relaxation techniques. This can be using breathing and muscle tension release routines. It can also include visualizing peaceful scenes. You can also help control troubling or intruding thoughts by keeping your mind occupied with boring or repetitive thoughts like the old concept of counting sheep. You can make it more creative like imagining planting one beautiful flower after another in your backyard garden.  During your day, work to eliminate stress. When this is not possible use some of the previous suggestions to help reduce the anxiety that accompanies stressful situations. MAKE SURE YOU:   Understand these instructions.  Will watch your condition.  Will get help right away if you are not doing well or get worse. Document Released: 01/20/2000 Document Revised: 04/16/2011 Document Reviewed: 02/19/2007 [facility] Patient Information 2015 [place], [place]. This information is not intended to replace advice given to you by your health care provider. Make sure you discuss any questions you have with your health care provider.   Vitamin D goal is between 60-80  Please make sure that you are taking your Vitamin D as directed.   It is very important as a natural anti-inflammatory   helping hair, skin, and nails, as well as reducing stroke and heart attack risk.   It helps your bones and helps with mood.  It also decreases numerous cancer risks so please take it as directed.   Low Vit D is associated with a 200-300% higher risk for CANCER   and 200-300% higher risk for HEART   ATTACK  &  STROKE.    .....................................Marland Kitchen  It is also associated with higher death rate at younger ages,   autoimmune diseases like Rheumatoid arthritis, Lupus, Multiple Sclerosis.     Also many other serious conditions, like depression, Alzheimer's  Dementia, infertility, muscle aches,  fatigue, fibromyalgia - just to name a few.  +++++++++++++++++++  Fatigue Fatigue is feeling tired all of the time, a lack of energy, or a lack of motivation. Occasional or mild fatigue is often a normal response to activity or life in general. However, long-lasting (chronic) or extreme fatigue may indicate an underlying medical condition. HOME CARE INSTRUCTIONS  Watch your fatigue for any changes. The following actions may help to lessen any discomfort you are feeling:  Talk to your health care provider about how much sleep you need each night. Try to get the required amount every night.  Take medicines only as directed by your health care provider.  Eat a healthy and nutritious diet. Ask your health care provider if you need help changing your diet.  Drink enough fluid to keep your urine clear or pale yellow.  Practice ways of relaxing, such as yoga, meditation, massage therapy, or acupuncture.  Exercise regularly.   Change situations that cause you stress. Try to keep your work and personal routine reasonable.  Do not abuse illegal drugs.  Limit alcohol intake to no more than 1 drink per day for nonpregnant women and 2 drinks per day for men. One drink equals 12 ounces of beer, 5 ounces of wine, or 1 ounces of hard liquor.  Take a multivitamin, if  directed by your health care provider. SEEK MEDICAL CARE IF:   Your fatigue does not get better.  You have a fever.   You have unintentional weight loss or gain.  You have headaches.   You have difficulty:   Falling asleep.  Sleeping throughout the night.  You feel angry, guilty, anxious, or sad.   You are unable to have a bowel movement (constipation).   You skin is dry.   Your legs or another part of your body is swollen.  SEEK IMMEDIATE MEDICAL CARE IF:   You feel confused.   Your vision is blurry.  You feel faint or pass out.   You have a severe headache.   You have severe abdominal, pelvic,  or back pain.   You have chest pain, shortness of breath, or an irregular or fast heartbeat.   You are unable to urinate or you urinate less than normal.   You develop abnormal bleeding, such as bleeding from the rectum, vagina, nose, lungs, or nipples.  You vomit blood.   You have thoughts about harming yourself or committing suicide.   You are worried that you might harm someone else.    This information is not intended to replace advice given to you by your health care provider. Make sure you discuss any questions you have with your health care provider.   Document Released: 11/19/2006 Document Revised: 02/12/2014 Document Reviewed: 05/26/2013 [employer] Interactive Patient Education [employer]

## 2014-12-17 LAB — URINALYSIS, ROUTINE W REFLEX MICROSCOPIC
BILIRUBIN URINE: NEGATIVE
Glucose, UA: NEGATIVE
Hgb urine dipstick: NEGATIVE
Leukocytes, UA: NEGATIVE
Nitrite: NEGATIVE
PROTEIN: NEGATIVE
Specific Gravity, Urine: 1.018 (ref 1.001–1.035)
pH: 5.5 (ref 5.0–8.0)

## 2014-12-17 LAB — MICROALBUMIN / CREATININE URINE RATIO
CREATININE, URINE: 97 mg/dL (ref 20–320)
MICROALB UR: 0.3 mg/dL
MICROALB/CREAT RATIO: 3 ug/mg{creat} (ref <30)

## 2014-12-17 LAB — VITAMIN D 25 HYDROXY (VIT D DEFICIENCY, FRACTURES): VIT D 25 HYDROXY: 47 ng/mL (ref 30–100)

## 2015-07-02 ENCOUNTER — Other Ambulatory Visit: Payer: Self-pay | Admitting: Physician Assistant

## 2015-07-22 ENCOUNTER — Other Ambulatory Visit: Payer: Self-pay

## 2015-07-22 MED ORDER — ETONOGESTREL-ETHINYL ESTRADIOL 0.12-0.015 MG/24HR VA RING: VAGINAL_RING | VAGINAL | Status: DC

## 2015-08-24 ENCOUNTER — Telehealth: Payer: Self-pay | Admitting: Physician Assistant

## 2015-08-24 NOTE — Telephone Encounter (Signed)
Patient states she is going to [place] at the end of august and would like to know what vaccines to get. She was informed to [facility] for a [facility] there.

## 2015-08-24 NOTE — Telephone Encounter (Signed)
Spoke pt &  Informed her that she would need to go to the health dept. For any travel vaccines. Pt agreed & voiced understanding.

## 2015-12-19 ENCOUNTER — Encounter: Payer: Self-pay | Admitting: Physician Assistant

## 2016-01-04 ENCOUNTER — Ambulatory Visit (INDEPENDENT_AMBULATORY_CARE_PROVIDER_SITE_OTHER): Payer: Managed Care, Other (non HMO) | Admitting: Physician Assistant

## 2016-01-04 ENCOUNTER — Other Ambulatory Visit (HOSPITAL_COMMUNITY)
Admission: RE | Admit: 2016-01-04 | Discharge: 2016-01-04 | Disposition: A | Discharge status: Home / Self Care | Payer: Managed Care, Other (non HMO) | Source: Ambulatory Visit | Attending: Physician Assistant | Admitting: Physician Assistant

## 2016-01-04 ENCOUNTER — Encounter: Payer: Self-pay | Admitting: Physician Assistant

## 2016-01-04 VITALS — BP 124/78 | HR 64 | Resp 16 | Ht 65.0 in | Wt 132.2 lb

## 2016-01-04 DIAGNOSIS — Z1151 Encounter for screening for human papillomavirus (HPV): Secondary | ICD-10-CM | POA: Insufficient documentation

## 2016-01-04 DIAGNOSIS — Z0000 Encounter for general adult medical examination without abnormal findings: Secondary | ICD-10-CM | POA: Diagnosis not present

## 2016-01-04 DIAGNOSIS — Z113 Encounter for screening for infections with a predominantly sexual mode of transmission: Secondary | ICD-10-CM

## 2016-01-04 DIAGNOSIS — Z124 Encounter for screening for malignant neoplasm of cervix: Secondary | ICD-10-CM

## 2016-01-04 DIAGNOSIS — R87810 Cervical high risk human papillomavirus (HPV) DNA test positive: Secondary | ICD-10-CM | POA: Insufficient documentation

## 2016-01-04 DIAGNOSIS — E559 Vitamin D deficiency, unspecified: Secondary | ICD-10-CM

## 2016-01-04 DIAGNOSIS — Z1389 Encounter for screening for other disorder: Secondary | ICD-10-CM

## 2016-01-04 DIAGNOSIS — Z01419 Encounter for gynecological examination (general) (routine) without abnormal findings: Secondary | ICD-10-CM | POA: Insufficient documentation

## 2016-01-04 DIAGNOSIS — Z79899 Other long term (current) drug therapy: Secondary | ICD-10-CM

## 2016-01-04 DIAGNOSIS — Z13220 Encounter for screening for lipoid disorders: Secondary | ICD-10-CM

## 2016-01-04 LAB — CBC WITH DIFFERENTIAL/PLATELET
BASOS PCT: 0 %
Basophils Absolute: 0 cells/uL (ref 0–200)
EOS ABS: 0 {cells}/uL — AB (ref 15–500)
Eosinophils Relative: 0 %
HEMATOCRIT: 45.1 % — AB (ref 35.0–45.0)
Hemoglobin: 14.8 g/dL (ref 11.7–15.5)
LYMPHS PCT: 31 %
Lymphs Abs: 2325 cells/uL (ref 850–3900)
MCH: 32.9 pg (ref 27.0–33.0)
MCHC: 32.8 g/dL (ref 32.0–36.0)
MCV: 100.2 fL — AB (ref 80.0–100.0)
MONO ABS: 300 {cells}/uL (ref 200–950)
MONOS PCT: 4 %
MPV: 11.7 fL (ref 7.5–12.5)
NEUTROS PCT: 65 %
Neutro Abs: 4875 cells/uL (ref 1500–7800)
PLATELETS: 178 10*3/uL (ref 140–400)
RBC: 4.5 MIL/uL (ref 3.80–5.10)
RDW: 13.9 % (ref 11.0–15.0)
WBC: 7.5 10*3/uL (ref 3.8–10.8)

## 2016-01-04 LAB — TSH: TSH: 0.88 m[IU]/L

## 2016-01-04 NOTE — Patient Instructions (Signed)
Preventive Care for Adults A healthy lifestyle and preventive care can promote health and wellness. Preventive health guidelines for women include the following key practices.  A routine yearly physical is a good way to check with your health care provider about your health and preventive screening. It is a chance to share any concerns and updates on your health and to receive a thorough exam.  Visit your dentist for a routine exam and preventive care every 6 months. Brush your teeth twice a day and floss once a day. Good oral hygiene prevents tooth decay and gum disease.  The frequency of eye exams is based on your age, health, family medical history, use of contact lenses, and other factors. Follow your health care provider's recommendations for frequency of eye exams.  Eat a healthy diet. Foods like vegetables, fruits, whole grains, low-fat dairy products, and lean protein foods contain the nutrients you need without too many calories. Decrease your intake of foods high in solid fats, added sugars, and salt. Eat the right amount of calories for you.Get information about a proper diet from your health care provider, if necessary.  Regular physical exercise is one of the most important things you can do for your health. Most adults should get at least 150 minutes of moderate-intensity exercise (any activity that increases your heart rate and causes you to sweat) each week. In addition, most adults need muscle-strengthening exercises on 2 or more days a week.  Maintain a healthy weight. The body mass index (BMI) is a screening tool to identify possible weight problems. It provides an estimate of body fat based on height and weight. Your health care provider can find your BMI and can help you achieve or maintain a healthy weight.For adults 20 years and older:  A BMI below 18.5 is considered underweight.  A BMI of 18.5 to 24.9 is normal.  A BMI of 25 to 29.9 is considered overweight.  A BMI of  30 and above is considered obese.  Maintain normal blood lipids and cholesterol levels by exercising and minimizing your intake of saturated fat. Eat a balanced diet with plenty of fruit and vegetables. Blood tests for lipids and cholesterol should begin at age 76 and be repeated every 5 years. If your lipid or cholesterol levels are high, you are over 50, or you are at high risk for heart disease, you may need your cholesterol levels checked more frequently.Ongoing high lipid and cholesterol levels should be treated with medicines if diet and exercise are not working.  If you smoke, find out from your health care provider how to quit. If you do not use tobacco, do not start.  Lung cancer screening is recommended for adults aged 22-80 years who are at high risk for developing lung cancer because of a history of smoking. A yearly low-dose CT scan of the lungs is recommended for people who have at least a 30-pack-year history of smoking and are a current smoker or have quit within the past 15 years. A pack year of smoking is smoking an average of 1 pack of cigarettes a day for 1 year (for example: 1 pack a day for 30 years or 2 packs a day for 15 years). Yearly screening should continue until the smoker has stopped smoking for at least 15 years. Yearly screening should be stopped for people who develop a health problem that would prevent them from having lung cancer treatment.  If you are pregnant, do not drink alcohol. If you are breastfeeding,  be very cautious about drinking alcohol. If you are not pregnant and choose to drink alcohol, do not have more than 1 drink per day. One drink is considered to be 12 ounces (355 mL) of beer, 5 ounces (148 mL) of wine, or 1.5 ounces (44 mL) of liquor.  Avoid use of street drugs. Do not share needles with anyone. Ask for help if you need support or instructions about stopping the use of drugs.  High blood pressure causes heart disease and increases the risk of  stroke. Your blood pressure should be checked at least every 1 to 2 years. Ongoing high blood pressure should be treated with medicines if weight loss and exercise do not work.  If you are 3-86 years old, ask your health care provider if you should take aspirin to prevent strokes.  Diabetes screening involves taking a blood sample to check your fasting blood sugar level. This should be done once every 3 years, after age 67, if you are within normal weight and without risk factors for diabetes. Testing should be considered at a younger age or be carried out more frequently if you are overweight and have at least 1 risk factor for diabetes.  Breast cancer screening is essential preventive care for women. You should practice "breast self-awareness." This means understanding the normal appearance and feel of your breasts and may include breast self-examination. Any changes detected, no matter how small, should be reported to a health care provider. Women in their 8s and 30s should have a clinical breast exam (CBE) by a health care provider as part of a regular health exam every 1 to 3 years. After age 70, women should have a CBE every year. Starting at age 25, women should consider having a mammogram (breast X-ray test) every year. Women who have a family history of breast cancer should talk to their health care provider about genetic screening. Women at a high risk of breast cancer should talk to their health care providers about having an MRI and a mammogram every year.  Breast cancer gene (BRCA)-related cancer risk assessment is recommended for women who have family members with BRCA-related cancers. BRCA-related cancers include breast, ovarian, tubal, and peritoneal cancers. Having family members with these cancers may be associated with an increased risk for harmful changes (mutations) in the breast cancer genes BRCA1 and BRCA2. Results of the assessment will determine the need for genetic counseling and  BRCA1 and BRCA2 testing.  Routine pelvic exams to screen for cancer are no longer recommended for nonpregnant women who are considered low risk for cancer of the pelvic organs (ovaries, uterus, and vagina) and who do not have symptoms. Ask your health care provider if a screening pelvic exam is right for you.  If you have had past treatment for cervical cancer or a condition that could lead to cancer, you need Pap tests and screening for cancer for at least 20 years after your treatment. If Pap tests have been discontinued, your risk factors (such as having a new sexual partner) need to be reassessed to determine if screening should be resumed. Some women have medical problems that increase the chance of getting cervical cancer. In these cases, your health care provider may recommend more frequent screening and Pap tests.  The HPV test is an additional test that may be used for cervical cancer screening. The HPV test looks for the virus that can cause the cell changes on the cervix. The cells collected during the Pap test can be  tested for HPV. The HPV test could be used to screen women aged 30 years and older, and should be used in women of any age who have unclear Pap test results. After the age of 30, women should have HPV testing at the same frequency as a Pap test.  Colorectal cancer can be detected and often prevented. Most routine colorectal cancer screening begins at the age of 50 years and continues through age 75 years. However, your health care provider may recommend screening at an earlier age if you have risk factors for colon cancer. On a yearly basis, your health care provider may provide home test kits to check for hidden blood in the stool. Use of a small camera at the end of a tube, to directly examine the colon (sigmoidoscopy or colonoscopy), can detect the earliest forms of colorectal cancer. Talk to your health care provider about this at age 50, when routine screening begins. Direct  exam of the colon should be repeated every 5-10 years through age 75 years, unless early forms of pre-cancerous polyps or small growths are found.  People who are at an increased risk for hepatitis B should be screened for this virus. You are considered at high risk for hepatitis B if:  You were born in a country where hepatitis B occurs often. Talk with your health care provider about which countries are considered high risk.  Your parents were born in a high-risk country and you have not received a shot to protect against hepatitis B (hepatitis B vaccine).  You have HIV or AIDS.  You use needles to inject street drugs.  You live with, or have sex with, someone who has hepatitis B.  You get hemodialysis treatment.  You take certain medicines for conditions like cancer, organ transplantation, and autoimmune conditions.  Hepatitis C blood testing is recommended for all people born from 1945 through 1965 and any individual with known risks for hepatitis C.  Practice safe sex. Use condoms and avoid high-risk sexual practices to reduce the spread of sexually transmitted infections (STIs). STIs include gonorrhea, chlamydia, syphilis, trichomonas, herpes, HPV, and human immunodeficiency virus (HIV). Herpes, HIV, and HPV are viral illnesses that have no cure. They can result in disability, cancer, and death.  You should be screened for sexually transmitted illnesses (STIs) including gonorrhea and chlamydia if:  You are sexually active and are younger than 24 years.  You are older than 24 years and your health care provider tells you that you are at risk for this type of infection.  Your sexual activity has changed since you were last screened and you are at an increased risk for chlamydia or gonorrhea. Ask your health care provider if you are at risk.  If you are at risk of being infected with HIV, it is recommended that you take a prescription medicine daily to prevent HIV infection. This is  called preexposure prophylaxis (PrEP). You are considered at risk if:  You are a heterosexual woman, are sexually active, and are at increased risk for HIV infection.  You take drugs by injection.  You are sexually active with a partner who has HIV.  Talk with your health care provider about whether you are at high risk of being infected with HIV. If you choose to begin PrEP, you should first be tested for HIV. You should then be tested every 3 months for as long as you are taking PrEP.  Osteoporosis is a disease in which the bones lose minerals and strength   with aging. This can result in serious bone fractures or breaks. The risk of osteoporosis can be identified using a bone density scan. Women ages 65 years and over and women at risk for fractures or osteoporosis should discuss screening with their health care providers. Ask your health care provider whether you should take a calcium supplement or vitamin D to reduce the rate of osteoporosis.  Menopause can be associated with physical symptoms and risks. Hormone replacement therapy is available to decrease symptoms and risks. You should talk to your health care provider about whether hormone replacement therapy is right for you.  Use sunscreen. Apply sunscreen liberally and repeatedly throughout the day. You should seek shade when your shadow is shorter than you. Protect yourself by wearing long sleeves, pants, a wide-brimmed hat, and sunglasses year round, whenever you are outdoors.  Once a month, do a whole body skin exam, using a mirror to look at the skin on your back. Tell your health care provider of new moles, moles that have irregular borders, moles that are larger than a pencil eraser, or moles that have changed in shape or color.  Stay current with required vaccines (immunizations).  Influenza vaccine. All adults should be immunized every year.  Tetanus, diphtheria, and acellular pertussis (Td, Tdap) vaccine. Pregnant women should  receive 1 dose of Tdap vaccine during each pregnancy. The dose should be obtained regardless of the length of time since the last dose. Immunization is preferred during the 27th-36th week of gestation. An adult who has not previously received Tdap or who does not know her vaccine status should receive 1 dose of Tdap. This initial dose should be followed by tetanus and diphtheria toxoids (Td) booster doses every 10 years. Adults with an unknown or incomplete history of completing a 3-dose immunization series with Td-containing vaccines should begin or complete a primary immunization series including a Tdap dose. Adults should receive a Td booster every 10 years.  Varicella vaccine. An adult without evidence of immunity to varicella should receive 2 doses or a second dose if she has previously received 1 dose. Pregnant females who do not have evidence of immunity should receive the first dose after pregnancy. This first dose should be obtained before leaving the health care facility. The second dose should be obtained 4-8 weeks after the first dose.  Human papillomavirus (HPV) vaccine. Females aged 13-26 years who have not received the vaccine previously should obtain the 3-dose series. The vaccine is not recommended for use in pregnant females. However, pregnancy testing is not needed before receiving a dose. If a female is found to be pregnant after receiving a dose, no treatment is needed. In that case, the remaining doses should be delayed until after the pregnancy. Immunization is recommended for any person with an immunocompromised condition through the age of 26 years if she did not get any or all doses earlier. During the 3-dose series, the second dose should be obtained 4-8 weeks after the first dose. The third dose should be obtained 24 weeks after the first dose and 16 weeks after the second dose.  Zoster vaccine. One dose is recommended for adults aged 60 years or older unless certain conditions are  present.  Measles, mumps, and rubella (MMR) vaccine. Adults born before 1957 generally are considered immune to measles and mumps. Adults born in 1957 or later should have 1 or more doses of MMR vaccine unless there is a contraindication to the vaccine or there is laboratory evidence of immunity to   each of the three diseases. A routine second dose of MMR vaccine should be obtained at least 28 days after the first dose for students attending postsecondary schools, health care workers, or international travelers. People who received inactivated measles vaccine or an unknown type of measles vaccine during 1963-1967 should receive 2 doses of MMR vaccine. People who received inactivated mumps vaccine or an unknown type of mumps vaccine before 1979 and are at high risk for mumps infection should consider immunization with 2 doses of MMR vaccine. For females of childbearing age, rubella immunity should be determined. If there is no evidence of immunity, females who are not pregnant should be vaccinated. If there is no evidence of immunity, females who are pregnant should delay immunization until after pregnancy. Unvaccinated health care workers born before 1957 who lack laboratory evidence of measles, mumps, or rubella immunity or laboratory confirmation of disease should consider measles and mumps immunization with 2 doses of MMR vaccine or rubella immunization with 1 dose of MMR vaccine.  Pneumococcal 13-valent conjugate (PCV13) vaccine. When indicated, a person who is uncertain of her immunization history and has no record of immunization should receive the PCV13 vaccine. An adult aged 19 years or older who has certain medical conditions and has not been previously immunized should receive 1 dose of PCV13 vaccine. This PCV13 should be followed with a dose of pneumococcal polysaccharide (PPSV23) vaccine. The PPSV23 vaccine dose should be obtained at least 8 weeks after the dose of PCV13 vaccine. An adult aged 19  years or older who has certain medical conditions and previously received 1 or more doses of PPSV23 vaccine should receive 1 dose of PCV13. The PCV13 vaccine dose should be obtained 1 or more years after the last PPSV23 vaccine dose.  Pneumococcal polysaccharide (PPSV23) vaccine. When PCV13 is also indicated, PCV13 should be obtained first. All adults aged 65 years and older should be immunized. An adult younger than age 65 years who has certain medical conditions should be immunized. Any person who resides in a nursing home or long-term care facility should be immunized. An adult smoker should be immunized. People with an immunocompromised condition and certain other conditions should receive both PCV13 and PPSV23 vaccines. People with human immunodeficiency virus (HIV) infection should be immunized as soon as possible after diagnosis. Immunization during chemotherapy or radiation therapy should be avoided. Routine use of PPSV23 vaccine is not recommended for American Indians, [place] Natives, or people younger than 65 years unless there are medical conditions that require PPSV23 vaccine. When indicated, people who have unknown immunization and have no record of immunization should receive PPSV23 vaccine. One-time revaccination 5 years after the first dose of PPSV23 is recommended for people aged 19-64 years who have chronic kidney failure, nephrotic syndrome, asplenia, or immunocompromised conditions. People who received 1-2 doses of PPSV23 before age 65 years should receive another dose of PPSV23 vaccine at age 65 years or later if at least 5 years have passed since the previous dose. Doses of PPSV23 are not needed for people immunized with PPSV23 at or after age 65 years.  Meningococcal vaccine. Adults with asplenia or persistent complement component deficiencies should receive 2 doses of quadrivalent meningococcal conjugate (MenACWY-D) vaccine. The doses should be obtained at least 2 months apart.  Microbiologists working with certain meningococcal bacteria, military recruits, people at risk during an outbreak, and people who travel to or live in countries with a high rate of meningitis should be immunized. A first-year college student up through age   21 years who is living in a residence hall should receive a dose if she did not receive a dose on or after her 16th birthday. Adults who have certain high-risk conditions should receive one or more doses of vaccine.  Hepatitis A vaccine. Adults who wish to be protected from this disease, have certain high-risk conditions, work with hepatitis A-infected animals, work in hepatitis A research labs, or travel to or work in countries with a high rate of hepatitis A should be immunized. Adults who were previously unvaccinated and who anticipate close contact with an international adoptee during the first 60 days after arrival in the [place] from a country with a high rate of hepatitis A should be immunized.  Hepatitis B vaccine. Adults who wish to be protected from this disease, have certain high-risk conditions, may be exposed to blood or other infectious body fluids, are household contacts or sex partners of hepatitis B positive people, are clients or workers in certain care facilities, or travel to or work in countries with a high rate of hepatitis B should be immunized.  Haemophilus influenzae type b (Hib) vaccine. A previously unvaccinated person with asplenia or sickle cell disease or having a scheduled splenectomy should receive 1 dose of Hib vaccine. Regardless of previous immunization, a recipient of a hematopoietic stem cell transplant should receive a 3-dose series 6-12 months after her successful transplant. Hib vaccine is not recommended for adults with HIV infection. Preventive Services / Frequency  Ages 19 to 39 years 1. Blood pressure check. 2. Lipid and cholesterol check. 3. Clinical breast exam.** / Every 3 years for women in their  20s and 30s. 4. BRCA-related cancer risk assessment.** / For women who have family members with a BRCA-related cancer (breast, ovarian, tubal, or peritoneal cancers). 5. Pap test.** / Every 2 years from ages 21 through 29. Every 3 years starting at age 30 through age 65 or 70 with a history of 3 consecutive normal Pap tests. 6. HPV screening.** / Every 3 years from ages 30 through ages 65 to 70 with a history of 3 consecutive normal Pap tests. 7. Hepatitis C blood test.** / For any individual with known risks for hepatitis C. 8. Skin self-exam. / Monthly. 9. Influenza vaccine. / Every year. 10. Tetanus, diphtheria, and acellular pertussis (Tdap, Td) vaccine.** / Consult your health care provider. Pregnant women should receive 1 dose of Tdap vaccine during each pregnancy. 1 dose of Td every 10 years. 11. Varicella vaccine.** / Consult your health care provider. Pregnant females who do not have evidence of immunity should receive the first dose after pregnancy. 12. HPV vaccine. / 3 doses over 6 months, if 26 and younger. The vaccine is not recommended for use in pregnant females. However, pregnancy testing is not needed before receiving a dose. 13. Measles, mumps, rubella (MMR) vaccine.** / You need at least 1 dose of MMR if you were born in 1957 or later. You may also need a 2nd dose. For females of childbearing age, rubella immunity should be determined. If there is no evidence of immunity, females who are not pregnant should be vaccinated. If there is no evidence of immunity, females who are pregnant should delay immunization until after pregnancy. 14. Pneumococcal 13-valent conjugate (PCV13) vaccine.** / Consult your health care provider. 15. Pneumococcal polysaccharide (PPSV23) vaccine.** / 1 to 2 doses if you smoke cigarettes or if you have certain conditions. 16. Meningococcal vaccine.** / 1 dose if you are age 19 to 21 years and a   first-year college student living in a residence hall, or have one  of several medical conditions, you need to get vaccinated against meningococcal disease. You may also need additional booster doses. 17. Hepatitis A vaccine.** / Consult your health care provider. 18. Hepatitis B vaccine.** / Consult your health care provider. 19. Haemophilus influenzae type b (Hib) vaccine.** / Consult your health care provider.  24. Chlamydia, HIV, and other sexually transmitted diseases- Get screened every year until age 54, then within three months of each new sexual provider. 21. Pap Smear- Every 1-3 years; discuss with your health care provider. 28. Mammogram- Every year starting at age 40  Take these steps 1. Do not smoke-Your healthcare provider can help you quit.  For tips on how to quit go to www.smokefree.gov or call 1-800 QUITNOW. 2. Be physically active- Exercise 5 days a week for at least 30 minutes.  If you are not already physically active, start slow and gradually work up to 30 minutes of moderate physical activity.  Examples of moderate activity include walking briskly, dancing, swimming, bicycling, etc. 3. Breast Cancer- A self breast exam every month is important for early detection of breast cancer.  For more information and instruction on self breast exams, ask your healthcare provider or https://www.patel.info/. 4. Eat a healthy diet- Eat a variety of healthy foods such as fruits, vegetables, whole grains, low fat milk, low fat cheeses, yogurt, lean meats, poultry and fish, beans, nuts, tofu, etc.  For more information go to www. Thenutritionsource.org 5. Drink alcohol in moderation- Limit alcohol intake to one drink or less per day. Never drink and drive. 6. Depression- Your emotional health is as important as your physical health.  If you're feeling down or losing interest in things you normally enjoy please talk to your healthcare provider about being screened for depression. 7. Dental visit- Brush and floss your teeth twice daily;  visit your dentist twice a year. 8. Eye doctor- Get an eye exam at least every 2 years. 9. Helmet use- Always wear a helmet when riding a bicycle, motorcycle, rollerblading or skateboarding. 6. Safe sex- If you may be exposed to sexually transmitted infections, use a condom. 11. Seat belts- Seat belts can save your live; always wear one. 12. Smoke/Carbon Monoxide detectors- These detectors need to be installed on the appropriate level of your home. Replace batteries at least once a year. 13. Skin cancer- When out in the sun please cover up and use sunscreen 15 SPF or higher. 14. Violence- If anyone is threatening or hurting you, please tell your healthcare provider.

## 2016-01-04 NOTE — Progress Notes (Signed)
Complete Physical  Assessment and Plan: Routine general medical examination at a health care facility - Vitamin D, Ergocalciferol, (DRISDOL) 50000 UNITS CAPS capsule; 1-2 times a week for deficiency.  Dispense: 30 capsule; Refill: 1 - CBC with Differential/Platelet - BASIC METABOLIC PANEL WITH GFR - Hepatic function panel - TSH - Lipid panel - Magnesium - Microalbumin / creatinine urine ratio - Urinalysis, Routine w reflex microscopic (not at [facility]) - Vit D  25 hydroxy (rtn osteoporosis monitoring) - Iron and TIBC - Ferritin - Vitamin B12  2. Screening for cervical cancer Will come back for PAP  Screening cholesterol level - Lipid panel  Vitamin D deficiency - Vit D  25 hydroxy (rtn osteoporosis monitoring)  Screening for blood or protein in urine - Microalbumin / creatinine urine ratio - Urinalysis, Routine w reflex microscopic (not at [facility])  Medication management - CBC with Differential/Platelet - BASIC METABOLIC PANEL WITH GFR - Hepatic function panel - TSH - Magnesium   Discussed med's effects and SE's. Screening labs and tests as requested with regular follow-up as recommended.  HPI  35 y.o. female  presents for a complete physical. Her blood pressure has been controlled at home, today their BP is BP: 124/78 She does workout. She denies chest pain, shortness of breath, dizziness.  She is not on cholesterol medication and denies myalgias. Her cholesterol is at goal. 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 Is doing  [employer] for allergies.  She is not on Nuvaring but not sexually active.  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   Current Medications:    Medication List       Accurate as of 01/04/16 10:09 AM. Always use your most recent med list.          ADVIL 200 MG Caps Generic drug:  Ibuprofen Take 200 mg by mouth as needed.   EPINEPHrine 0.3 mg/0.3 mL Soaj injection Commonly known as:  EPIPEN 2-PAK Inject 0.3 mLs (0.3 mg total) into the muscle as needed.   etonogestrel-ethinyl estradiol 0.12-0.015 MG/24HR vaginal ring Commonly known as:  NUVARING INSERT 1 RING VAGINALLY FOR 3 WEEKS THEN REMOVE FOR 1 WEEK   phenazopyridine 97 MG tablet Commonly known as:  PYRIDIUM Take 1 tablet (97 mg total) by mouth 3 (three) times daily as needed for pain.   Vitamin D (Ergocalciferol) 50000 units Caps capsule Commonly known as:  DRISDOL 1-2 times a week for deficiency.   ZYRTEC ALLERGY PO Take by mouth as needed.      Health Maintenance:   Immunization History  Administered Date(s) Administered  . HPV Quadrivalent 07/19/2005, 10/09/2005, 01/17/2006  . Tdap 09/17/2007   Tetanus:2009 Pneumovax: Flu vaccine: declines Gardasil X 3 last one 2007 Zostavax:  LMP: Yesterday Pap: normal PAP 2013 due 2017, will wait until next year MGM:  DEXA: Colonoscopy: EGD: CT head 2011  Patient Care Team: Lucky CowboyWilliam McKeown, MD as PCP - General (Internal Medicine)  Medical  History: No past medical history on file.   Allergies No Known Allergies  SURGICAL HISTORY She  has a past surgical history that includes Closed reduction clavicle fracture (Right, 2011). FAMILY HISTORY Her family history includes Cancer in her father and other; Heart attack in her other; Hypertension in her other; Stroke in her other. SOCIAL HISTORY She  reports that she has never smoked. She has never used smokeless tobacco. She reports that she drinks alcohol. She reports that she does not use  drugs.   Review of Systems  Constitutional: Negative for chills, diaphoresis, fever, malaise/fatigue and weight loss.  HENT: Negative for congestion, ear discharge, ear pain, hearing loss, nosebleeds, sore throat and tinnitus.        Snoring  Respiratory: Negative.  Negative for stridor.   Cardiovascular: Negative.   Gastrointestinal: Negative.   Genitourinary: Negative.   Musculoskeletal: Negative.   Skin: Negative.   Neurological: Negative.  Negative for weakness and headaches.  Psychiatric/Behavioral: Negative for depression, hallucinations, memory loss, substance abuse and suicidal ideas. The patient is not nervous/anxious and does not have insomnia.      Physical Exam: Estimated body mass index is 22 kg/m as calculated from the following:   Height as of this encounter: 5\' 5"  (1.651 m).   Weight as of this encounter: 132 lb 3.2 oz (60 kg). BP 124/78   Pulse 64   Resp 16   Ht 5\' 5"  (1.651 m)   Wt 132 lb 3.2 oz (60 kg)   LMP 12/22/2015   SpO2 98%   BMI 22.00 kg/m  General Appearance: Well nourished, in no apparent distress.  Eyes: PERRLA, EOMs, conjunctiva no swelling or erythema, normal fundi and vessels.  Sinuses: No Frontal/maxillary tenderness  ENT/Mouth: Ext aud canals clear, normal light reflex with TMs without erythema, bulging. Good dentition. No erythema, swelling, or exudate on post pharynx. Tonsils not swollen or erythematous. Hearing normal.  Neck: Supple, thyroid normal. No bruits  Respiratory: Respiratory effort normal, BS equal bilaterally without rales, rhonchi, wheezing or stridor.  Cardio: RRR without murmurs, rubs or gallops. Brisk peripheral pulses without edema.  Chest: symmetric, with normal excursions and percussion.  Breasts: Symmetric, without lumps, nipple discharge, retractions.  Abdomen: Soft, nontender, no guarding, rebound, hernias, masses, or organomegaly. .  Lymphatics: Non tender without lymphadenopathy.  Genitourinary: defer next  year Musculoskeletal: Full ROM all peripheral extremities,5/5 strength, and normal gait.  Skin: Warm, dry without rashes, lesions, ecchymosis. Neuro: Cranial nerves intact, reflexes equal bilaterally. Normal muscle tone, no cerebellar symptoms. Sensation intact.  Psych: Awake and oriented X 3, normal affect, Insight and Judgment appropriate.   EKG: defer   Quentin MullingAmanda Neema Barreira 10:09 AM [facility]

## 2016-01-05 LAB — BASIC METABOLIC PANEL WITH GFR
BUN: 9 mg/dL (ref 7–25)
CALCIUM: 10.1 mg/dL (ref 8.6–10.2)
CO2: 22 mmol/L (ref 20–31)
CREATININE: 0.8 mg/dL (ref 0.50–1.10)
Chloride: 103 mmol/L (ref 98–110)
GFR, Est Non African American: >89 mL/min (ref >=60)
GLUCOSE: 90 mg/dL (ref 65–99)
Potassium: 4.1 mmol/L (ref 3.5–5.3)
Sodium: 142 mmol/L (ref 135–146)

## 2016-01-05 LAB — URINALYSIS, ROUTINE W REFLEX MICROSCOPIC
Bilirubin Urine: NEGATIVE
Glucose, UA: NEGATIVE
HGB URINE DIPSTICK: NEGATIVE
KETONES UR: NEGATIVE
LEUKOCYTES UA: NEGATIVE
NITRITE: NEGATIVE
PH: 7 (ref 5.0–8.0)
Protein, ur: NEGATIVE
SPECIFIC GRAVITY, URINE: 1.004 (ref 1.001–1.035)

## 2016-01-05 LAB — RPR

## 2016-01-05 LAB — HEPATIC FUNCTION PANEL
ALBUMIN: 4.7 g/dL (ref 3.6–5.1)
ALT: 10 U/L (ref 6–29)
AST: 14 U/L (ref 10–30)
Alkaline Phosphatase: 44 U/L (ref 33–115)
Bilirubin, Direct: 0.1 mg/dL (ref <=0.2)
Indirect Bilirubin: 0.4 mg/dL (ref 0.2–1.2)
TOTAL PROTEIN: 7.4 g/dL (ref 6.1–8.1)
Total Bilirubin: 0.5 mg/dL (ref 0.2–1.2)

## 2016-01-05 LAB — HIV ANTIBODY (ROUTINE TESTING W REFLEX): HIV 1&2 Ab, 4th Generation: NONREACTIVE

## 2016-01-05 LAB — MICROALBUMIN / CREATININE URINE RATIO: CREATININE, URINE: 13 mg/dL — AB (ref 20–320)

## 2016-01-05 LAB — GC/CHLAMYDIA PROBE AMP
CT Probe RNA: NOT DETECTED
GC PROBE AMP APTIMA: NOT DETECTED

## 2016-01-05 LAB — VITAMIN D 25 HYDROXY (VIT D DEFICIENCY, FRACTURES): Vit D, 25-Hydroxy: 46 ng/mL (ref 30–100)

## 2016-01-05 LAB — HSV(HERPES SIMPLEX VRS) I + II AB-IGG
HSV 1 GLYCOPROTEIN G AB, IGG: 13.7 {index} — AB (ref <0.90)
HSV 2 Glycoprotein G Ab, IgG: <0.9 Index (ref <0.90)

## 2016-01-05 LAB — LIPID PANEL
Cholesterol: 185 mg/dL (ref <200)
HDL: 75 mg/dL (ref >50)
LDL Cholesterol: 77 mg/dL (ref <100)
TRIGLYCERIDES: 164 mg/dL — AB (ref <150)
Total CHOL/HDL Ratio: 2.5 Ratio (ref <5.0)
VLDL: 33 mg/dL — ABNORMAL HIGH (ref <30)

## 2016-01-05 LAB — MAGNESIUM: MAGNESIUM: 2.2 mg/dL (ref 1.5–2.5)

## 2016-01-06 LAB — CYTOLOGY - PAP
DIAGNOSIS: NEGATIVE
HPV 16/18/45 genotyping: NEGATIVE
HPV: DETECTED — AB

## 2016-02-18 ENCOUNTER — Encounter (HOSPITAL_COMMUNITY): Payer: Self-pay | Admitting: Emergency Medicine

## 2016-02-18 ENCOUNTER — Ambulatory Visit (HOSPITAL_COMMUNITY)
Admission: EM | Admit: 2016-02-18 | Discharge: 2016-02-18 | Disposition: A | Discharge status: Home / Self Care | Payer: Managed Care, Other (non HMO) | Attending: Internal Medicine | Admitting: Internal Medicine

## 2016-02-18 DIAGNOSIS — B349 Viral infection, unspecified: Secondary | ICD-10-CM | POA: Diagnosis not present

## 2016-02-18 DIAGNOSIS — H65191 Other acute nonsuppurative otitis media, right ear: Secondary | ICD-10-CM

## 2016-02-18 MED ORDER — AMOXICILLIN 500 MG PO CAPS: 500.0000 mg | ORAL_CAPSULE | Freq: Two times a day (BID) | ORAL | 0 refills | Status: DC

## 2016-02-18 NOTE — ED Triage Notes (Signed)
The [facility] with a complaint of bilateral ear fullness x 4 days and general body aches that started today.

## 2016-02-18 NOTE — Discharge Instructions (Signed)
You have an ear effusion (of fluid) which may treat with Mucinex Max strength as directed with 4oz of water. Also may use Ibuprofen 800 mg for achiness. The antibiotic is on reserve if the ears do not improve within a few day or you worsen. Feel better.

## 2016-02-18 NOTE — ED Provider Notes (Signed)
CSN: [id number]     Arrival date & time 02/18/16  1732 History   None    Chief Complaint  Patient presents with  . Otalgia  . Generalized Body Aches   (Consider location/radiation/quality/duration/timing/severity/associated sxs/prior Treatment)   36 yo otherwise healthy female presents with 4 days of ear pain and "fullness". Today she began to feel poorly with body aches and chills. No cough or nasal congestion. No ear drainage is noted. No fevers or known exposures.         History reviewed. No pertinent past medical history. Past Surgical History:  Procedure Laterality Date  . CLOSED REDUCTION CLAVICLE FRACTURE Right 2011   Family History  Problem Relation Age of Onset  . Cancer Father     skin  . Hypertension Other   . Stroke Other   . Cancer Other     skin  . Heart attack Other    Social History  Substance Use Topics  . Smoking status: Never Smoker  . Smokeless tobacco: Never Used  . Alcohol use Yes     Comment: Occasionally   OB History    No data available     Review of Systems  Constitutional: Positive for chills and fatigue. Negative for fever.  HENT: Positive for ear pain. Negative for congestion, sinus pain and sinus pressure.   Eyes: Negative.   Respiratory: Negative.     Allergies  Patient has no known allergies.  Home Medications   Prior to Admission medications   Medication Sig Start Date End Date Taking? Authorizing Provider  etonogestrel-ethinyl estradiol (NUVARING) 0.12-0.015 MG/24HR vaginal ring INSERT 1 RING VAGINALLY FOR 3 WEEKS THEN REMOVE FOR 1 WEEK 07/22/15  Yes Quentin MullingAmanda Collier, PA-C  Ibuprofen (ADVIL) 200 MG CAPS Take 200 mg by mouth as needed.   Yes Historical Provider, MD  amoxicillin (AMOXIL) 500 MG capsule Take 1 capsule (500 mg total) by mouth 2 (two) times daily. 02/18/16   Riki SheerMichelle G Dairon Procter, PA-C   Meds Ordered and Administered this Visit  Medications - No data to display  BP 118/74 (BP Location: Left Arm)   Pulse 80    Temp 98.8 F (37.1 C) (Oral)   Resp 18   LMP 01/18/2016 (Approximate)   SpO2 99%  No data found.   Physical Exam  Constitutional: She appears well-developed and well-nourished.  HENT:  Head: Normocephalic and atraumatic.  Right ear with mild effusion, left ear with retro fluid, no frank effusion, oropharynx with injection, no exudate  Cardiovascular: Normal rate and regular rhythm.   Pulmonary/Chest: Effort normal and breath sounds normal.  Lymphadenopathy:    She has no cervical adenopathy.  Skin: Skin is warm and dry.  Psychiatric: Her behavior is normal.  Nursing note and vitals reviewed.   Urgent Care Course   Clinical Course     Procedures (including critical care time)  Labs Review Labs Reviewed - No data to display  Imaging Review No results found.   Visual Acuity Review  Right Eye Distance:   Left Eye Distance:   Bilateral Distance:    Right Eye Near:   Left Eye Near:    Bilateral Near:         MDM   1. Acute effusion of right ear   2. Viral syndrome    Treat effusion with Amoxicillin and Mucinex Max strength and fluid and rest. F/U if needed.     Riki SheerMichelle G Tesla Bochicchio, PA-C 02/18/16 2002

## 2016-08-29 ENCOUNTER — Ambulatory Visit: Payer: Self-pay | Admitting: Physician Assistant

## 2016-08-30 ENCOUNTER — Other Ambulatory Visit: Payer: Self-pay

## 2016-08-30 ENCOUNTER — Encounter: Payer: Self-pay | Admitting: Physician Assistant

## 2016-08-30 ENCOUNTER — Ambulatory Visit (INDEPENDENT_AMBULATORY_CARE_PROVIDER_SITE_OTHER): Payer: 59 | Admitting: Physician Assistant

## 2016-08-30 ENCOUNTER — Other Ambulatory Visit (HOSPITAL_COMMUNITY)
Admission: RE | Admit: 2016-08-30 | Discharge: 2016-08-30 | Disposition: A | Discharge status: Home / Self Care | Payer: 59 | Source: Ambulatory Visit | Attending: Physician Assistant | Admitting: Physician Assistant

## 2016-08-30 VITALS — BP 122/86 | HR 77 | Temp 97.7°F | Resp 14 | Ht 65.0 in | Wt 126.6 lb

## 2016-08-30 DIAGNOSIS — R87810 Cervical high risk human papillomavirus (HPV) DNA test positive: Secondary | ICD-10-CM | POA: Insufficient documentation

## 2016-08-30 DIAGNOSIS — Z113 Encounter for screening for infections with a predominantly sexual mode of transmission: Secondary | ICD-10-CM

## 2016-08-30 DIAGNOSIS — N941 Unspecified dyspareunia: Secondary | ICD-10-CM

## 2016-08-30 MED ORDER — EPINEPHRINE 0.3 MG/0.3ML IJ SOAJ: 0.3000 mg | Freq: Once | INTRAMUSCULAR | 0 refills | Status: DC

## 2016-08-30 NOTE — Progress Notes (Signed)
Subjective:    Patient ID: Ashlee MorasAlisha Savitt, female    DOB: 1980-07-10, 36 y.o.   MRN: [id number]  HPI 36 y.o. WF presents for dyspareunia.  Has had sex in the last 6 months. She states feels like "sand paper" on the sides with intercourse, painful with initiation, has some vaginal dryness, has been using lubricant that is not helping, using sustain natural lubricant.    She is on Nuvaring. Normal PAP 2013 other than + HPV high risk. Negative STD testing 12/2015.   Blood pressure 122/86, pulse 77, temperature 97.7 F (36.5 C), resp. rate 14, height 5\' 5"  (1.651 m), weight 126 lb 9.6 oz (57.4 kg), last menstrual period 08/09/2016, SpO2 98 %.  Medications Current Outpatient Prescriptions on File Prior to Visit  Medication Sig  . etonogestrel-ethinyl estradiol (NUVARING) 0.12-0.015 MG/24HR vaginal ring INSERT 1 RING VAGINALLY FOR 3 WEEKS THEN REMOVE FOR 1 WEEK  . Ibuprofen (ADVIL) 200 MG CAPS Take 200 mg by mouth as needed.   No current facility-administered medications on file prior to visit.     Problem list She has Encounter for general adult medical examination with abnormal findings on her problem list.   Review of Systems  Constitutional: Negative.   HENT: Negative.   Respiratory: Negative.   Cardiovascular: Negative.   Gastrointestinal: Negative.   Genitourinary: Positive for dyspareunia and vaginal pain. Negative for decreased urine volume, difficulty urinating, dysuria, enuresis, flank pain, frequency, genital sores, hematuria, menstrual problem, pelvic pain, urgency, vaginal bleeding and vaginal discharge.  Musculoskeletal: Negative.  Negative for arthralgias and myalgias.  Skin: Negative.  Negative for rash.  Neurological: Negative.   Hematological: Negative.   Psychiatric/Behavioral: Negative.        Objective:   Physical Exam  Constitutional: She is oriented to person, place, and time. She appears well-developed and well-nourished.  HENT:  Head: Normocephalic  and atraumatic.  Right Ear: External ear normal.  Left Ear: External ear normal.  Mouth/Throat: Oropharynx is clear and moist.  Eyes: Pupils are equal, round, and reactive to light. Conjunctivae and EOM are normal.  Neck: Normal range of motion. Neck supple. No thyromegaly present.  Cardiovascular: Normal rate, regular rhythm and normal heart sounds.  Exam reveals no gallop and no friction rub.   No murmur heard. Pulmonary/Chest: Effort normal and breath sounds normal. No respiratory distress. She has no wheezes.  Abdominal: Soft. Bowel sounds are normal. She exhibits no distension and no mass. There is no tenderness. There is no rebound and no guarding.  Genitourinary: Uterus normal.    No labial fusion. There is no rash, lesion or injury on the right labia. There is no rash, lesion or injury on the left labia. Uterus is not tender. Cervix exhibits discharge. Cervix exhibits no motion tenderness and no friability. Right adnexum displays no mass, no tenderness and no fullness. Left adnexum displays no mass, no tenderness and no fullness. There is erythema in the vagina. No tenderness or bleeding in the vagina. No foreign body in the vagina. Vaginal discharge found.  Musculoskeletal: Normal range of motion.  Lymphadenopathy:    She has no cervical adenopathy.  Neurological: She is alert and oriented to person, place, and time. She displays normal reflexes. No cranial nerve deficit. Coordination normal.  Skin: Skin is warm and dry.  Psychiatric: She has a normal mood and affect.        Assessment & Plan:   Dyspareunia, female No rash seen, + discharge, labs sent off, ? Possible lichen sclerosis ?  Need to remove Nuvaring  -     GC/Chlamydia Probe Amp -     WET PREP BY MOLECULAR PROBE -     Cytology - PAP  Screen for STD (sexually transmitted disease) -     GC/Chlamydia Probe Amp -     WET PREP BY MOLECULAR PROBE -     Cytology - PAP

## 2016-08-30 NOTE — Patient Instructions (Signed)
Vulvar Pain Vulvar pain is long-lasting (chronic) pain in the external female genitalia (vulva). This condition may also be called vulvodynia. The vulva includes tissues surrounding the vaginal opening and the urethra. Vulvar pain often has no known cause. There are two main types of vulvar pain:  Localized vulvar pain. This is when pain affects a specific area of the vulva. ? Vulvar vestibulitis syndrome (VVS) is a type of localized vulvar pain in which pain is only felt around the opening (vestibule) of the vagina.  Generalized vulvar pain. This is when pain affects: ? The entire vulva. ? Multiple areas of the vulva. ? One side of the vulva.  What are the causes? The cause of vulvar pain is often not known, and many factors may contribute to it. It may be a type of nerve pain (neuropathic pain). What increases the risk? Women who have any of the following conditions may be more likely to develop vulvar pain:  Pelvic floor dysfunction.  A disorder that affects a protein (interleukin 1 beta receptor antagonist) involved in inflammation in the body.  An increased number of pain receptor nerves throughout the body.  Painful bladder.  Irritable bowel syndrome (IBS).  Fibromyalgia.  Restless sleep.  Depression.  Certain pain conditions.  What are the signs or symptoms? The main symptom of this condition is pain that affects part of the vulva or the entire vulva. Pain may:  Feel like a burning, tearing, stinging, or sharp, knife-like sensation.  Be chronic and ongoing.  Occur off and on (intermittently).  Get worse when touching or putting pressure on the vulva, such as with tampon insertion, sexual intercourse, sitting for a long time, or wearing tight pants.  Symptoms may also include:  Swelling or redness of the vulva, perineum, or inner thighs.  Psychological or emotional distress.  How is this diagnosed? This condition may be diagnosed based on:  Your symptoms  and your medical history. Your health care provider may ask questions about what causes or worsens your pain.  A physical exam of your abdomen and pelvis. This may include a cotton swab test, in which your health care provider gently touches all areas of your vulva with a cotton swab to determine where you have pain.  Tests of your vaginal fluid. These tests may be done to check for infection.  How is this treated? Treatment for this condition depends on the cause and severity of your pain. Treatment may include:  Caring for your skin.  Working with a health care provider who specializes in pain.  Physical therapy.  Counseling (psychotherapy). This may include: ? Biofeedback. This is a type of therapy that helps you be more aware of your body's response to pain. It also helps you learn to deal with pain.  Alternative medical therapies to help relieve pain, such as: ? Hypnotherapy. This means being put in a state of altered consciousness (hypnosis). ? Acupuncture. This is the insertion of needles into certain places on the skin.  Surgery to remove affected parts of your vulva.  Follow these instructions at home: Clothing  Wear cotton underwear.  Avoid wearing tight pants.  Wash clothing in non-scented, mild laundry detergent. Do not use fabric softeners or dryer sheets. Eating and drinking  Avoid caffeine.  Avoid foods that are high in sugar or highly processed. General instructions   Take over-the-counter and prescription medicines only as told by your health care provider.  If any soaps, lotions, creams, or ointments cause or worsen your pain,  do not use these products on the affected areas.  Do not use feminine wipes or douches.  Do not use scented body soaps, scented pads, or scented tampons.  Clean your vulvar skin with warm water only and pat the area dry.  Consider using a non-scented silicone-based or oil-based lubricant during sexual intercourse.  Keep all  follow-up visits as told by your health care provider. This is important. Contact a health care provider if:  Your pain gets worse or does not improve after starting treatment.  You develop vaginal discharge that smells bad. This information is not intended to replace advice given to you by your health care provider. Make sure you discuss any questions you have with your health care provider. Document Released: 05/16/2015 Document Revised: 06/30/2015 Document Reviewed: 12/30/2013 Elsevier Interactive Patient Education  2018 [employer]   Dyspareunia, Female Dyspareunia is pain that is associated with sexual activity. This can affect any part of the genitals or lower abdomen, and there are many possible causes. This condition ranges from mild to severe. Depending on the cause, dyspareunia may get better with treatment, or it may return (recur) over time. What are the causes? The cause of this condition is not always known. Possible causes include:  Cancer.  Psychological factors, such as depression, anxiety, or previous traumatic experiences.  Severe pain and tenderness of the skin around the vagina (vulva) when it is touched (vulvar vestibulitis syndrome).  Infection of the pelvis or the vulva.  Infection of the vagina.  Painful, involuntary tightening (contraction) of the vaginal muscles when anything is put inside the vagina (vaginismus).  Allergic reaction.  Ovarian cysts.  Solid growths of tissue (tumors) in the ovaries or the uterus.  Scar tissue in the ovaries, vagina, or pelvis.  Vaginal dryness.  Thinning of the tissue (atrophy) of the vulva and vagina.  Skin conditions that affect the vulva (vulvar dermatoses), such as lichen sclerosus or lichen planus.  Endometriosis.  Tubal pregnancy.  A tilted uterus.  Uterine prolapse.  Adhesions in the vagina.  Bladder problems.  Intestinal problems.  Certain medicines.  Medical conditions such as diabetes,  arthritis, or thyroid disease.  What increases the risk? The following factors may make you more likely to develop this condition:  Having experienced physical or sexual trauma.  Having given birth more than once.  Taking birth control pills.  Having gone through menopause.  Having recently given birth, typically within the past 3-6 months.  Breastfeeding.  What are the signs or symptoms? The main symptom of this condition is pain in any part of the genitals or lower abdomen during or after sexual activity. This may include pain during sexual arousal, genital stimulation, or orgasm. Pain may get worse when anything is inserted into the vagina, or when the genitals are touched in any way, such as when sitting or wearing pants. Pain can range from mild to severe, depending on the cause of the condition. In some cases, symptoms go away with treatment and return (recur) at a later date. How is this diagnosed? This condition may be diagnosed based on:  Your symptoms, including: ? Where your pain is located. ? When your pain occurs.  Your medical history.  A physical exam. This may include a pelvic exam and a Pap test. This is a screening test that is used to check for signs of cancer of the vagina, cervix, and uterus.  Tests, including: ? Blood tests. ? Ultrasound. This uses sound waves to make a picture of  the area that is being tested. ? Urine culture. This test involves checking a urine sample for signs of infection. ? Culture test. This is when your health care provider uses a swab to collect a sample of vaginal fluid. The sample is checked for signs of infection. ? X-rays. ? MRI. ? CT scan. ? Laparoscopy. This is a procedure in which a small incision is made in your lower abdomen and a lighted, pencil-sized instrument (laparoscope) is passed through the incision and used to look inside your pelvis.  You may be referred to a health care provider who specializes in women's  health (gynecologist). In some cases, diagnosing the cause of dyspareunia can be difficult. How is this treated? Treatment depends on the cause of your condition and your symptoms. In most cases, you may need to stop sexual activity until your symptoms improve. Treatment may include:  Lubricants.  Kegel exercises or vaginal dilators.  Medicated skin creams.  Medicated vaginal creams.  Hormonal therapy.  Antibiotic medicine to prevent or fight infection.  Medicines that help to relieve pain.  Medicines that treat depression (antidepressants).  Psychological counseling.  Sex therapy.  Surgery.  Follow these instructions at home: Lifestyle  Avoid tight clothing and irritating materials around your genital and abdominal area.  Use water-based lubricants as needed. Avoid oil-based lubricants.  Do not use any products that irritate you. This may include certain condoms, spermicides, lubricants, soaps, tampons, vaginal sprays, or douches.  Always practice safe sex. Talk with your health care provider about which form of birth control (contraception) is best for you.  Maintain open communication with your sexual partner. General instructions  Take over-the-counter and prescription medicines only as told by your health care provider.  If you had tests done, it is your responsibility to get your tests results. Ask your health care provider or the department performing the test when your results will be ready.  Urinate before you engage in sexual activity.  Consider joining a support group.  Keep all follow-up visits as told by your health care provider. This is important. Contact a health care provider if:  You develop vaginal bleeding after sexual intercourse.  You develop a lump at the opening of your vagina. Seek medical care even if the lump is painless.  You have: ? Abnormal vaginal discharge. ? Vaginal dryness. ? Itchiness or irritation of your vulva or  vagina. ? A new rash. ? Symptoms that get worse or do not improve with treatment. ? A fever. ? Pain when you urinate. ? Blood in your urine. Get help right away if:  You develop severe pain in your abdomen during or shortly after sexual intercourse.  You pass out after having sexual intercourse. This information is not intended to replace advice given to you by your health care provider. Make sure you discuss any questions you have with your health care provider. Document Released: 02/11/2007 Document Revised: 06/03/2015 Document Reviewed: 08/24/2014 Elsevier Interactive Patient Education  [employer]2018 Elsevier Inc.

## 2016-08-31 LAB — GC/CHLAMYDIA PROBE AMP
CT Probe RNA: NOT DETECTED
GC Probe RNA: NOT DETECTED

## 2016-08-31 LAB — WET PREP BY MOLECULAR PROBE
CANDIDA SPECIES: NOT DETECTED
Gardnerella vaginalis: NOT DETECTED
TRICHOMONAS VAG: NOT DETECTED

## 2016-09-02 ENCOUNTER — Other Ambulatory Visit: Payer: Self-pay | Admitting: Physician Assistant

## 2016-09-02 ENCOUNTER — Encounter: Payer: Self-pay | Admitting: Physician Assistant

## 2016-09-03 ENCOUNTER — Other Ambulatory Visit: Payer: Self-pay | Admitting: Physician Assistant

## 2016-09-03 MED ORDER — TRIAMCINOLONE ACETONIDE 0.5 % EX CREA: 1.0000 "application " | TOPICAL_CREAM | Freq: Every day | CUTANEOUS | 2 refills | Status: DC

## 2016-09-03 NOTE — Progress Notes (Signed)
Pending PAP

## 2016-09-04 LAB — CYTOLOGY - PAP
ADEQUACY: ABSENT
Diagnosis: NEGATIVE
HPV 16/18/45 genotyping: NEGATIVE
HPV: DETECTED — AB

## 2017-01-06 NOTE — Progress Notes (Signed)
Complete Physical  Assessment and Plan: Routine general medical examination at a health care facility 1 year Declines TDAP at this time, high deductible   Screening for cervical cancer Due 3 years  Vitamin D deficiency - get on 2000 IU daily  Screening for blood or protein in urine - Microalbumin / creatinine urine ratio  Medication management - CBC with Differential/Platelet - BASIC METABOLIC PANEL WITH GFR - Hepatic function panel - TSH - Magnesium  BMI 22.0-22.9, adult  Screening for thyroid disorder -     TSH  Other orders -     EPINEPHrine 0.3 mg/0.3 mL IJ SOAJ injection; Inject 0.3 mLs (0.3 mg total) into the muscle once for 1 dose.   Discussed med's effects and SE's. Screening labs and tests as requested with regular follow-up as recommended.  HPI  36 y.o. female  presents for a complete physical. Her blood pressure has been controlled at home, today their BP is BP: 118/72 She does workout. She denies chest pain, shortness of breath, dizziness.  She is not on cholesterol medication and denies myalgias. Her cholesterol is at goal. 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 Is doing [employer] for allergies. Has part time job for young [employer], part of [employer].  She is not on Nuvaring but not sexually active.  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  BMI is Body mass index is 22.1 kg/m., she is working on diet and exercise. Wt Readings from Last 3 Encounters:   01/07/17 132 lb 12.8 oz (60.2 kg)  08/30/16 126 lb 9.6 oz (57.4 kg)  01/04/16 132 lb 3.2 oz (60 kg)     Current Medications:  Current Outpatient Medications on File Prior to Visit  Medication Sig  . Ibuprofen (ADVIL) 200 MG CAPS Take 200 mg by mouth as needed.  Marland Kitchen. NUVARING 0.12-0.015 MG/24HR vaginal ring INSERT 1 RING VAGINALLY FOR 3 WEEKS THEN REMOVE FOR 1 WEEK   No current facility-administered medications on file prior to visit.    Health Maintenance:   Immunization History  Administered Date(s) Administered  . HPV Quadrivalent 07/19/2005, 10/09/2005, 01/17/2006  . Tdap 09/17/2007   Tetanus:2009- wants to wait due to high deductible.  Pneumovax: Flu vaccine: declines Gardasil X 3 last one 2007 Zostavax:  LMP: Yesterday Pap: normal PAP 08/30/2016 neg HPV- due 2021 MGM:  DEXA: Colonoscopy: EGD: CT head 2011  Patient Care Team: Lucky CowboyMcKeown, William, MD as PCP - General (Internal Medicine)  Medical History: No past medical history on file.   Allergies No Known Allergies  SURGICAL HISTORY She  has a past surgical history that includes Closed reduction clavicle fracture (Right, 2011).   FAMILY HISTORY Her family history includes Cancer in her father and other; Heart attack in her other; Hypertension in her other; Stroke in her other.   SOCIAL HISTORY She  reports that  has never smoked. she has never used smokeless tobacco.  She reports that she drinks alcohol. She reports that she does not use drugs.   Review of Systems  Constitutional: Negative for chills, diaphoresis, fever, malaise/fatigue and weight loss.  HENT: Negative for congestion, ear discharge, ear pain, hearing loss, nosebleeds, sore throat and tinnitus.   Respiratory: Negative.  Negative for stridor.   Cardiovascular: Negative.   Gastrointestinal: Negative.   Genitourinary: Negative.   Musculoskeletal: Negative.   Skin: Negative.   Neurological: Negative.  Negative for weakness and headaches.   Psychiatric/Behavioral: Negative for depression, hallucinations, memory loss, substance abuse and suicidal ideas. The patient is not nervous/anxious and does not have insomnia.      Physical Exam: Estimated body mass index is 22.1 kg/m as calculated from the following:   Height as of this encounter: 5\' 5"  (1.651 m).   Weight as of this encounter: 132 lb 12.8 oz (60.2 kg). BP 118/72   Pulse 73   Temp (!) 97.5 F (36.4 C)   Ht 5\' 5"  (1.651 m)   Wt 132 lb 12.8 oz (60.2 kg)   LMP 12/25/2016   SpO2 99%   BMI 22.10 kg/m  General Appearance: Well nourished, in no apparent distress.  Eyes: PERRLA, EOMs, conjunctiva no swelling or erythema, normal fundi and vessels.  Sinuses: No Frontal/maxillary tenderness  ENT/Mouth: Ext aud canals clear, normal light reflex with TMs without erythema, bulging. Good dentition. No erythema, swelling, or exudate on post pharynx. Tonsils not swollen or erythematous. Hearing normal.  Neck: Supple, thyroid normal. No bruits  Respiratory: Respiratory effort normal, BS equal bilaterally without rales, rhonchi, wheezing or stridor.  Cardio: RRR without murmurs, rubs or gallops. Brisk peripheral pulses without edema.  Chest: symmetric, with normal excursions and percussion.  Breasts: Symmetric, without lumps, nipple discharge, retractions.  Abdomen: Soft, nontender, no guarding, rebound, hernias, masses, or organomegaly. .  Lymphatics: Non tender without lymphadenopathy.  Genitourinary: defer next year Musculoskeletal: Full ROM all peripheral extremities,5/5 strength, and normal gait.  Skin: Warm, dry without rashes, lesions, ecchymosis. Neuro: Cranial nerves intact, reflexes equal bilaterally. Normal muscle tone, no cerebellar symptoms. Sensation intact.  Psych: Awake and oriented X 3, normal affect, Insight and Judgment appropriate.   EKG: defer   Quentin MullingAmanda Solomon Skowronek 10:18 AM [facility]

## 2017-01-07 ENCOUNTER — Encounter: Payer: Self-pay | Admitting: Physician Assistant

## 2017-01-07 ENCOUNTER — Ambulatory Visit (INDEPENDENT_AMBULATORY_CARE_PROVIDER_SITE_OTHER): Payer: Self-pay | Admitting: Physician Assistant

## 2017-01-07 VITALS — BP 118/72 | HR 73 | Temp 97.5°F | Ht 65.0 in | Wt 132.8 lb

## 2017-01-07 DIAGNOSIS — Z1329 Encounter for screening for other suspected endocrine disorder: Secondary | ICD-10-CM

## 2017-01-07 DIAGNOSIS — Z0000 Encounter for general adult medical examination without abnormal findings: Secondary | ICD-10-CM

## 2017-01-07 DIAGNOSIS — Z1389 Encounter for screening for other disorder: Secondary | ICD-10-CM

## 2017-01-07 DIAGNOSIS — Z79899 Other long term (current) drug therapy: Secondary | ICD-10-CM

## 2017-01-07 DIAGNOSIS — E559 Vitamin D deficiency, unspecified: Secondary | ICD-10-CM

## 2017-01-07 DIAGNOSIS — Z6822 Body mass index (BMI) 22.0-22.9, adult: Secondary | ICD-10-CM

## 2017-01-07 MED ORDER — EPINEPHRINE 0.3 MG/0.3ML IJ SOAJ: 0.3000 mg | Freq: Once | INTRAMUSCULAR | 0 refills | Status: AC

## 2017-01-07 MED ORDER — EPINEPHRINE 0.3 MG/0.3ML IJ SOAJ: 0.3000 mg | Freq: Once | INTRAMUSCULAR | 0 refills | Status: DC

## 2017-01-07 NOTE — Patient Instructions (Signed)
Dyspareunia, Female Dyspareunia is pain that is associated with sexual activity. This can affect any part of the genitals or lower abdomen, and there are many possible causes. This condition ranges from mild to severe. Depending on the cause, dyspareunia may get better with treatment, or it may return (recur) over time. What are the causes? The cause of this condition is not always known. Possible causes include:  Cancer.  Psychological factors, such as depression, anxiety, or previous traumatic experiences.  Severe pain and tenderness of the skin around the vagina (vulva) when it is touched (vulvar vestibulitis syndrome).  Infection of the pelvis or the vulva.  Infection of the vagina.  Painful, involuntary tightening (contraction) of the vaginal muscles when anything is put inside the vagina (vaginismus).  Allergic reaction.  Ovarian cysts.  Solid growths of tissue (tumors) in the ovaries or the uterus.  Scar tissue in the ovaries, vagina, or pelvis.  Vaginal dryness.  Thinning of the tissue (atrophy) of the vulva and vagina.  Skin conditions that affect the vulva (vulvar dermatoses), such as lichen sclerosus or lichen planus.  Endometriosis.  Tubal pregnancy.  A tilted uterus.  Uterine prolapse.  Adhesions in the vagina.  Bladder problems.  Intestinal problems.  Certain medicines.  Medical conditions such as diabetes, arthritis, or thyroid disease.  What increases the risk? The following factors may make you more likely to develop this condition:  Having experienced physical or sexual trauma.  Having given birth more than once.  Taking birth control pills.  Having gone through menopause.  Having recently given birth, typically within the past 3-6 months.  Breastfeeding.  What are the signs or symptoms? The main symptom of this condition is pain in any part of the genitals or lower abdomen during or after sexual activity. This may include pain  during sexual arousal, genital stimulation, or orgasm. Pain may get worse when anything is inserted into the vagina, or when the genitals are touched in any way, such as when sitting or wearing pants. Pain can range from mild to severe, depending on the cause of the condition. In some cases, symptoms go away with treatment and return (recur) at a later date. How is this diagnosed? This condition may be diagnosed based on:  Your symptoms, including: ? Where your pain is located. ? When your pain occurs.  Your medical history.  A physical exam. This may include a pelvic exam and a Pap test. This is a screening test that is used to check for signs of cancer of the vagina, cervix, and uterus.  Tests, including: ? Blood tests. ? Ultrasound. This uses sound waves to make a picture of the area that is being tested. ? Urine culture. This test involves checking a urine sample for signs of infection. ? Culture test. This is when your health care provider uses a swab to collect a sample of vaginal fluid. The sample is checked for signs of infection. ? X-rays. ? MRI. ? CT scan. ? Laparoscopy. This is a procedure in which a small incision is made in your lower abdomen and a lighted, pencil-sized instrument (laparoscope) is passed through the incision and used to look inside your pelvis.  You may be referred to a health care provider who specializes in women's health (gynecologist). In some cases, diagnosing the cause of dyspareunia can be difficult. How is this treated? Treatment depends on the cause of your condition and your symptoms. In most cases, you may need to stop sexual activity until your symptoms   improve. Treatment may include:  Lubricants.  Kegel exercises or vaginal dilators.  Medicated skin creams.  Medicated vaginal creams.  Hormonal therapy.  Antibiotic medicine to prevent or fight infection.  Medicines that help to relieve pain.  Medicines that treat depression  (antidepressants).  Psychological counseling.  Sex therapy.  Surgery.  Follow these instructions at home: Lifestyle  Avoid tight clothing and irritating materials around your genital and abdominal area.  Use water-based lubricants as needed. Avoid oil-based lubricants.  Do not use any products that irritate you. This may include certain condoms, spermicides, lubricants, soaps, tampons, vaginal sprays, or douches.  Always practice safe sex. Talk with your health care provider about which form of birth control (contraception) is best for you.  Maintain open communication with your sexual partner. General instructions  Take over-the-counter and prescription medicines only as told by your health care provider.  If you had tests done, it is your responsibility to get your tests results. Ask your health care provider or the department performing the test when your results will be ready.  Urinate before you engage in sexual activity.  Consider joining a support group.  Keep all follow-up visits as told by your health care provider. This is important. Contact a health care provider if:  You develop vaginal bleeding after sexual intercourse.  You develop a lump at the opening of your vagina. Seek medical care even if the lump is painless.  You have: ? Abnormal vaginal discharge. ? Vaginal dryness. ? Itchiness or irritation of your vulva or vagina. ? A new rash. ? Symptoms that get worse or do not improve with treatment. ? A fever. ? Pain when you urinate. ? Blood in your urine. Get help right away if:  You develop severe pain in your abdomen during or shortly after sexual intercourse.  You pass out after having sexual intercourse. This information is not intended to replace advice given to you by your health care provider. Make sure you discuss any questions you have with your health care provider. Document Released: 02/11/2007 Document Revised: 06/03/2015 Document  Reviewed: 08/24/2014 Elsevier Interactive Patient Education  2018 [employer] Inc[employer]  

## 2017-01-08 LAB — CBC WITH DIFFERENTIAL/PLATELET
BASOS ABS: 20 {cells}/uL (ref 0–200)
Basophils Relative: 0.3 %
EOS PCT: 0.6 %
Eosinophils Absolute: 41 cells/uL (ref 15–500)
HCT: 45.9 % — ABNORMAL HIGH (ref 35.0–45.0)
Hemoglobin: 15.6 g/dL — ABNORMAL HIGH (ref 11.7–15.5)
Lymphs Abs: 1802 cells/uL (ref 850–3900)
MCH: 31.8 pg (ref 27.0–33.0)
MCHC: 34 g/dL (ref 32.0–36.0)
MCV: 93.7 fL (ref 80.0–100.0)
MPV: 12.3 fL (ref 7.5–12.5)
Monocytes Relative: 5.4 %
NEUTROS PCT: 67.2 %
Neutro Abs: 4570 cells/uL (ref 1500–7800)
PLATELETS: 173 10*3/uL (ref 140–400)
RBC: 4.9 10*6/uL (ref 3.80–5.10)
RDW: 12.8 % (ref 11.0–15.0)
TOTAL LYMPHOCYTE: 26.5 %
WBC: 6.8 10*3/uL (ref 3.8–10.8)
WBCMIX: 367 {cells}/uL (ref 200–950)

## 2017-01-08 LAB — BASIC METABOLIC PANEL WITH GFR
BUN: 9 mg/dL (ref 7–25)
CHLORIDE: 104 mmol/L (ref 98–110)
CO2: 26 mmol/L (ref 20–32)
CREATININE: 0.75 mg/dL (ref 0.50–1.10)
Calcium: 9.7 mg/dL (ref 8.6–10.2)
GFR, EST NON AFRICAN AMERICAN: 103 mL/min/{1.73_m2} (ref >=60)
GFR, Est African American: 119 mL/min/{1.73_m2} (ref >=60)
Glucose, Bld: 67 mg/dL (ref 65–99)
Potassium: 3.8 mmol/L (ref 3.5–5.3)
SODIUM: 140 mmol/L (ref 135–146)

## 2017-01-08 LAB — HEPATIC FUNCTION PANEL
AG Ratio: 1.8 (calc) (ref 1.0–2.5)
ALT: 10 U/L (ref 6–29)
AST: 13 U/L (ref 10–30)
Albumin: 4.8 g/dL (ref 3.6–5.1)
Alkaline phosphatase (APISO): 48 U/L (ref 33–115)
Bilirubin, Direct: 0.1 mg/dL (ref 0.0–0.2)
Globulin: 2.6 g/dL (ref 1.9–3.7)
Indirect Bilirubin: 0.3 mg/dL (ref 0.2–1.2)
Total Bilirubin: 0.4 mg/dL (ref 0.2–1.2)
Total Protein: 7.4 g/dL (ref 6.1–8.1)

## 2017-01-08 LAB — TSH: TSH: 1.21 m[IU]/L

## 2017-01-08 LAB — MICROALBUMIN / CREATININE URINE RATIO
CREATININE, URINE: 7 mg/dL — AB (ref 20–275)
MICROALB/CREAT RATIO: 29 ug/mg{creat} (ref <30)
Microalb, Ur: 0.2 mg/dL

## 2017-03-13 ENCOUNTER — Other Ambulatory Visit: Payer: Self-pay | Admitting: Internal Medicine

## 2017-03-13 DIAGNOSIS — Z3044 Encounter for surveillance of vaginal ring hormonal contraceptive device: Secondary | ICD-10-CM

## 2017-03-13 MED ORDER — ETONOGESTREL-ETHINYL ESTRADIOL 0.12-0.015 MG/24HR VA RING: VAGINAL_RING | VAGINAL | 4 refills | Status: DC

## 2017-04-16 ENCOUNTER — Other Ambulatory Visit: Payer: Self-pay

## 2017-04-16 DIAGNOSIS — Z3044 Encounter for surveillance of vaginal ring hormonal contraceptive device: Secondary | ICD-10-CM

## 2017-04-16 MED ORDER — ETONOGESTREL-ETHINYL ESTRADIOL 0.12-0.015 MG/24HR VA RING: VAGINAL_RING | VAGINAL | 4 refills | Status: DC

## 2018-01-14 NOTE — Progress Notes (Signed)
Complete Physical  Assessment and Plan:  Routine general medical examination at a health care facility 1 year  Vitamin D deficiency -     VITAMIN D 25 Hydroxy (Vit-D Deficiency, Fractures)  Screening for blood or protein in urine -     Urinalysis, Routine w reflex microscopic  Medication management -     CBC with Differential/Platelet -     COMPLETE METABOLIC PANEL WITH GFR -     Magnesium  Need for diphtheria-tetanus-pertussis (Tdap) vaccine -     Tdap vaccine greater than or equal to 7yo IM  Family history of cardiovascular disease -     Lipid panel - very concerned for own health, father passed suddenly at 91, he had just run 2 miles, seemed healthy - will do aggressive risk control - will start to do plant based diet - may get advanced lipids in the future  Family history of thyroid disease -     TSH -     Thyroglobulin antibody -     Thyroid peroxidase antibody  Screening, anemia, deficiency, iron -     Iron,Total/Total Iron Binding Cap -     Vitamin B12  Other migraine without status migrainosus, not intractable -     SUMAtriptan (IMITREX) 100 MG tablet; Take 1 tablet (100 mg total) by mouth once as needed for migraine. May repeat in 2 hours if headache persists or recurs. - no red flags with it, no vision changes, etc Will go to the ER if worsening headache, changes vision/speech, imbalance, weakness.  Fatigue, unspecified type Check labs Get on B12 ? Sleep apnea- she has fatigue, frequent awakenings especially if on her back- declines at this time due to money but will keep in mind in the future.  ? Part grief, continue to work out, eat well - 3 month follow up  Discussed med's effects and SE's. Screening labs and tests as requested with regular follow-up as recommended.  HPI  37 y.o. female  presents for a complete physical.  She has had a cold x thanksgiving, feels she is improving but stlil not feeling well.   Father passed unexpectedly at 51 from  MI. She states she has never felt good but this year she is really struggling with it this year. She has had increased migraines, once a month with her menses. She states she wakes up tired, tosses and turns a lot, she falls asleeps well, has good bed time routine, but will wake up. She is not taking her vitamin D. She states she knows she snores/drools, she will wake up if she sleeps on her back, has to sleep on her side. She is very tearful, she also filed for bankruptcy this year. 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   Her B12 was 322.  Mom now treated for thyroid at 78.   Her blood pressure has been controlled at home, today their BP is BP: 110/64 She does workout. She denies chest pain, shortness of breath, dizziness.  She is not on cholesterol medication and denies myalgias. Her cholesterol is at goal. 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 Is doing [employer] for allergies. Has part time job for young [employer], part of [employer].  She is not on Nuvaring but not sexually active.  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  BMI is Body mass index is 22.07 kg/m., she is working on diet and exercise. Wt Readings from Last 3 Encounters:  01/15/18 132 lb 9.6 oz (60.1 kg)  01/07/17 132 lb 12.8 oz (60.2 kg)  08/30/16 126 lb 9.6 oz (57.4 kg)     Current Medications:  Current Outpatient Medications on File Prior to Visit  Medication Sig  . etonogestrel-ethinyl estradiol  (NUVARING) 0.12-0.015 MG/24HR vaginal ring INSERT 1 RING VAGINALLY FOR 3 WEEKS THEN REMOVE FOR 1 WEEK  . Ibuprofen (ADVIL) 200 MG CAPS Take 200 mg by mouth as needed.   No current facility-administered medications on file prior to visit.    Health Maintenance:   Immunization History  Administered Date(s) Administered  . HPV Quadrivalent 07/19/2005, 10/09/2005, 01/17/2006  . Tdap 09/17/2007, 01/15/2018   Tetanus:2009- wants to wait due to high deductible.  Pneumovax: Flu vaccine: declines Gardasil X 3 last one 2007 Zostavax:  LMP: Yesterday Pap: normal PAP 08/30/2016 neg HPV- due 2021 MGM:  DEXA: Colonoscopy: EGD: CT head 2011  Patient Care Team: Lucky Cowboy, MD as PCP - General (Internal Medicine)  Medical History: No past medical history on file.   Allergies No Known Allergies  SURGICAL HISTORY She  has a past surgical history that includes Closed reduction clavicle fracture (Right, 2011).   FAMILY HISTORY Her family history includes Cancer in her father and paternal grandfather; Heart attack in her paternal grandfather; Heart disease (age of onset: 73) in her father; Hypertension in her paternal grandfather; Stroke in her paternal grandfather.   SOCIAL HISTORY She  reports that she has never smoked. She has never used smokeless tobacco. She reports that she drinks alcohol. She reports that she does not use drugs.   Review of Systems  Constitutional: Positive for malaise/fatigue. Negative for chills, diaphoresis, fever and weight loss.  HENT: Negative for congestion, ear discharge, ear pain, hearing loss, nosebleeds, sore throat and tinnitus.   Respiratory: Negative.  Negative for stridor.   Cardiovascular: Negative.   Gastrointestinal: Negative.   Genitourinary: Negative.   Musculoskeletal: Negative.  Negative for joint pain and myalgias.  Skin: Negative.   Neurological: Positive for headaches. Negative for dizziness, tingling, tremors, sensory change,  speech change, focal weakness, seizures, loss of consciousness and weakness.  Psychiatric/Behavioral: Negative for depression, hallucinations, memory loss, substance abuse and suicidal ideas. The patient has insomnia. The patient is not nervous/anxious.      Physical Exam: Estimated body mass index is 22.07 kg/m as calculated from the following:   Height as of this encounter: 5\' 5"  (1.651 m).   Weight as of this encounter: 132 lb 9.6 oz (60.1 kg). BP 110/64   Pulse 64   Temp 99.2 F (37.3 C)   Ht 5\' 5"  (1.651 m)   Wt 132 lb 9.6 oz (60.1 kg)   LMP 01/01/2018   SpO2 99%   BMI 22.07 kg/m  General Appearance: Well nourished, in no apparent distress.  Eyes: PERRLA, EOMs, conjunctiva no swelling or erythema, normal fundi and vessels.  Sinuses: No Frontal/maxillary tenderness  ENT/Mouth:  Ext aud canals clear, normal light reflex with TMs without erythema, bulging. Good dentition. No erythema, swelling, or exudate on post pharynx. Tonsils not swollen or erythematous. Hearing normal.  Neck: Supple, thyroid normal. No bruits  Respiratory: Respiratory effort normal, BS equal bilaterally without rales, rhonchi, wheezing or stridor.  Cardio: RRR without murmurs, rubs or gallops. Brisk peripheral pulses without edema.  Chest: symmetric, with normal excursions and percussion.  Breasts: Symmetric, without lumps, nipple discharge, retractions.  Abdomen: Soft, nontender, no guarding, rebound, hernias, masses, or organomegaly. .  Lymphatics: Non tender without lymphadenopathy.  Genitourinary: defer next year Musculoskeletal: Full ROM all peripheral extremities,5/5 strength, and normal gait.  Skin: Warm, dry without rashes, lesions, ecchymosis. Neuro: Cranial nerves intact, reflexes equal bilaterally. Normal muscle tone, no cerebellar symptoms. Sensation intact.  Psych: Awake and oriented X 3, normal affect, Insight and Judgment appropriate.   EKG: in paper chart, need to scan   Quentin MullingAmanda  Marylu Dudenhoeffer 12:17 PM [facility]

## 2018-01-15 ENCOUNTER — Ambulatory Visit: Payer: No Typology Code available for payment source | Admitting: Physician Assistant

## 2018-01-15 ENCOUNTER — Encounter: Payer: Self-pay | Admitting: Physician Assistant

## 2018-01-15 VITALS — BP 110/64 | HR 64 | Temp 99.2°F | Ht 65.0 in | Wt 132.6 lb

## 2018-01-15 DIAGNOSIS — Z79899 Other long term (current) drug therapy: Secondary | ICD-10-CM

## 2018-01-15 DIAGNOSIS — Z13220 Encounter for screening for lipoid disorders: Secondary | ICD-10-CM

## 2018-01-15 DIAGNOSIS — Z23 Encounter for immunization: Secondary | ICD-10-CM

## 2018-01-15 DIAGNOSIS — R5383 Other fatigue: Secondary | ICD-10-CM

## 2018-01-15 DIAGNOSIS — E559 Vitamin D deficiency, unspecified: Secondary | ICD-10-CM

## 2018-01-15 DIAGNOSIS — G43809 Other migraine, not intractable, without status migrainosus: Secondary | ICD-10-CM

## 2018-01-15 DIAGNOSIS — Z0000 Encounter for general adult medical examination without abnormal findings: Secondary | ICD-10-CM | POA: Diagnosis not present

## 2018-01-15 DIAGNOSIS — Z1389 Encounter for screening for other disorder: Secondary | ICD-10-CM

## 2018-01-15 DIAGNOSIS — Z1329 Encounter for screening for other suspected endocrine disorder: Secondary | ICD-10-CM

## 2018-01-15 DIAGNOSIS — Z8249 Family history of ischemic heart disease and other diseases of the circulatory system: Secondary | ICD-10-CM

## 2018-01-15 DIAGNOSIS — Z130 Encounter for screening for diseases of the blood and blood-forming organs and certain disorders involving the immune mechanism: Secondary | ICD-10-CM | POA: Diagnosis not present

## 2018-01-15 DIAGNOSIS — Z8349 Family history of other endocrine, nutritional and metabolic diseases: Secondary | ICD-10-CM

## 2018-01-15 MED ORDER — SUMATRIPTAN SUCCINATE 100 MG PO TABS: 100.0000 mg | ORAL_TABLET | Freq: Once | ORAL | 2 refills | Status: DC | PRN

## 2018-01-15 NOTE — Patient Instructions (Addendum)
B12 is low end of normal, add sublingual B12. The sublingual is better than the pills because likely you are not absorbing in your intestines well so the sublingual gets absorbed through your mouth and ensures you get the amount you need. Will help with energy, memory/concentration, decrease nerve pain, and help with weight loss. B12 is water soluble vitamin so you can not over dose on it, and anything you do not use will be sent out in your urine.   Can do a steroid nasal spary 1-2 sparys at night each nostril. Remember to spray each nostril twice towards the outer part of your eye.  Do not sniff but instead pinch your nose and tilt your head back to help the medicine get into your sinuses.  The best time to do this is at bedtime. Stop if you get blurred vision or nose bleeds.  Like nasonex or flonase   1.  Limit use of pain relievers to no more than 2 days out of week to prevent risk of rebound or medication-overuse headache. 2.  Keep headache diary 3.  Exercise, hydration, caffeine cessation, sleep hygiene, monitor for and avoid triggers 4.  Consider:  magnesium citrate 400mg  daily, riboflavin 400mg  daily, and coenzyme Q10 100mg  three times daily    If you are having frequent migraines we may put you on a once a day medication with fast acting medication to take. Also there is such a thing called rebound headache from over use of acute medications.  Please do not use rescue or acute medications more than 10 days a month or more than 3 days per week, this can cause a withdrawal and a rebound headache.  Here is more information below  Please remember, common headache triggers are: sleep deprivation, dehydration, overheating, stress, hypoglycemia or skipping meals and blood sugar fluctuations, excessive pain medications or excessive alcohol use or caffeine withdrawal. Some people have food triggers such as aged cheese, orange juice or chocolate, especially dark chocolate, or MSG (monosodium  glutamate). Try to avoid these headache triggers as much possible.   It may be helpful to keep a headache diary to figure out what makes your headaches worse or brings them on and what alleviates them. Some people report headache onset after exercise but studies have shown that regular exercise may actually prevent headaches from coming. If you have exercise-induced headaches, please make sure that you drink plenty of fluid before and after exercising and that you do not over do it and do not overheat.   Please go to the ER if there is weakness, thunderclap headache, visual changes, or any concerning factors    Migraine Headache A migraine headache is an intense, throbbing pain on one or both sides of your head. Recurrent migraines keep coming back. A migraine can last for 30 minutes to several hours. CAUSES  The exact cause of a migraine headache is not always known. However, a migraine may be caused when nerves in the brain become irritated and release chemicals that cause inflammation. This causes pain. Certain things may also trigger migraines, such as:   Alcohol.  Smoking.  Stress.  Menstruation.  Aged cheeses.  Foods or drinks that contain nitrates, glutamate, aspartame, or tyramine.  Lack of sleep.  Chocolate.  Caffeine.  Hunger.  Physical exertion.  Fatigue.  Medicines used to treat chest pain (nitroglycerine), birth control pills, estrogen, and some blood pressure medicines. SYMPTOMS   Pain on one or both sides of your head.  Pulsating or throbbing pain.  Severe pain that prevents daily activities.  Pain that is aggravated by any physical activity.  Nausea, vomiting, or both.  Dizziness.  Pain with exposure to bright lights, loud noises, or activity.  General sensitivity to bright lights, loud noises, or smells. Before you get a migraine, you may get warning signs that a migraine is coming (aura). An aura may include:  Seeing flashing  lights.  Seeing bright spots, halos, or zigzag lines.  Having tunnel vision or blurred vision.  Having feelings of numbness or tingling.  Having trouble talking.  Having muscle weakness. DIAGNOSIS  A recurrent migraine headache is often diagnosed based on:  Symptoms.  Physical examination.  A CT scan or MRI of your head. These imaging tests cannot diagnose migraines but can help rule out other causes of headaches.   TREATMENT  Medicines may be given for pain and nausea. Medicines can also be given to help prevent recurrent migraines. HOME CARE INSTRUCTIONS  Only take over-the-counter or prescription medicines for pain or discomfort as directed by your health care provider. The use of long-term narcotics is not recommended.  Lie down in a dark, quiet room when you have a migraine.  Keep a journal to find out what may trigger your migraine headaches. For example, write down:  What you eat and drink.  How much sleep you get.  Any change to your diet or medicines.  Limit alcohol consumption.  Quit smoking if you smoke.  Get 7-9 hours of sleep, or as recommended by your health care provider.  Limit stress.  Keep lights dim if bright lights bother you and make your migraines worse. SEEK MEDICAL CARE IF:   You do not get relief from the medicines given to you.  You have a recurrence of pain.  You have a fever. SEEK IMMEDIATE MEDICAL CARE IF:  Your migraine becomes severe.  You have a stiff neck.  You have loss of vision.  You have muscular weakness or loss of muscle control.  You start losing your balance or have trouble walking.  You feel faint or pass out. . You have severe symptoms that are different from your first symptoms. MAKE SURE YOU:   Understand these instructions.  Will watch your condition.  Will get help right away if you are not doing well or get worse.   This information is not intended to replace advice given to you by your health  care provider. Make sure you discuss any questions you have with your health care provider.   Document Released: 10/17/2000 Document Revised: 02/12/2014 Document Reviewed: 09/29/2012 Elsevier Interactive Patient Education 2016 [employer].  Common Migraine Triggers   Foods Aged cheese, alcohol, nuts, chocolate, yogurt, onions, figs, liver, caffeinated foods and beverages, monosodium glutamate (MSG), smoked or pickled fish/meat, nitrate/nitrate preserved foods (hotdogs, pepperoni, salami) tyramine  Medications Antibiotics (tetracycline, griseofulvin), antihypertensives (nifedipine, captopril), hormones (oral contraceptives, estrogens), histamine-2 blockers (cimetidine, raniidine, vasodilators (nitroglycerine, isosorbide dinitrate)  Sensory Stimuli Flickering/bright/fluorescent lights, bright sunlight, odors (perfume, chemicals, cigarette smoke)  Lifestyle Changes Time zones, sleep patterns, eating habits, caffeine withdrawal stress  Other Menstrual cycle, weather/season/air pressure changes, high altitude  Adapted from [place] and [place], [place]. Clin. J. Med. 1995; [place] and [place]. Conquering Headache, 1998  Hormonal variations also are believed to play a part.  Fluctuations of the female hormone estrogen (such as just before menstruation) affect a chemical called serotonin-when serotonin levels in the brain fall, the dilation (expansion) of blood vessels in the brain that is characteristic of migraine often  follows.  Many factors or "triggers" can start a migraine.  In people who get migraines, most experts think certain activities or foods may trigger temporary changes in the blood vessels around the brain.  Swelling of these blood vessels may cause pain in the nearby nerves.  Allergy Headaches:  Hotdogs Milk  Onions  Thyme Bacon  Chocolate Garlic  Nutmeg Ham  Dark Cola Pork  Cinnamon Salami  Nuts  Egg  Ginger Sausage Red wine Cloves  Cheddar Cheese Caffeine

## 2018-01-16 LAB — CBC WITH DIFFERENTIAL/PLATELET
BASOS PCT: 0.4 %
Basophils Absolute: 29 cells/uL (ref 0–200)
Eosinophils Absolute: 101 cells/uL (ref 15–500)
Eosinophils Relative: 1.4 %
HCT: 42.1 % (ref 35.0–45.0)
Hemoglobin: 14.2 g/dL (ref 11.7–15.5)
LYMPHS ABS: 1613 {cells}/uL (ref 850–3900)
MCH: 31.8 pg (ref 27.0–33.0)
MCHC: 33.7 g/dL (ref 32.0–36.0)
MCV: 94.4 fL (ref 80.0–100.0)
MPV: 11.7 fL (ref 7.5–12.5)
Monocytes Relative: 5 %
Neutro Abs: 5098 cells/uL (ref 1500–7800)
Neutrophils Relative %: 70.8 %
Platelets: 234 10*3/uL (ref 140–400)
RBC: 4.46 10*6/uL (ref 3.80–5.10)
RDW: 13.1 % (ref 11.0–15.0)
TOTAL LYMPHOCYTE: 22.4 %
WBC: 7.2 10*3/uL (ref 3.8–10.8)
WBCMIX: 360 {cells}/uL (ref 200–950)

## 2018-01-16 LAB — COMPLETE METABOLIC PANEL WITH GFR
AG Ratio: 1.7 (calc) (ref 1.0–2.5)
ALKALINE PHOSPHATASE (APISO): 60 U/L (ref 33–115)
ALT: 14 U/L (ref 6–29)
AST: 14 U/L (ref 10–30)
Albumin: 4.5 g/dL (ref 3.6–5.1)
BUN: 9 mg/dL (ref 7–25)
CHLORIDE: 107 mmol/L (ref 98–110)
CO2: 24 mmol/L (ref 20–32)
CREATININE: 0.74 mg/dL (ref 0.50–1.10)
Calcium: 9.4 mg/dL (ref 8.6–10.2)
GFR, Est African American: 120 mL/min/{1.73_m2} (ref >=60)
GFR, Est Non African American: 103 mL/min/{1.73_m2} (ref >=60)
GLUCOSE: 79 mg/dL (ref 65–99)
Globulin: 2.7 g/dL (calc) (ref 1.9–3.7)
Potassium: 4.1 mmol/L (ref 3.5–5.3)
Sodium: 142 mmol/L (ref 135–146)
Total Bilirubin: 0.3 mg/dL (ref 0.2–1.2)
Total Protein: 7.2 g/dL (ref 6.1–8.1)

## 2018-01-16 LAB — LIPID PANEL
CHOLESTEROL: 185 mg/dL (ref <200)
HDL: 71 mg/dL (ref >50)
LDL Cholesterol (Calc): 89 mg/dL (calc)
Non-HDL Cholesterol (Calc): 114 mg/dL (calc) (ref <130)
Total CHOL/HDL Ratio: 2.6 (calc) (ref <5.0)
Triglycerides: 152 mg/dL — ABNORMAL HIGH (ref <150)

## 2018-01-16 LAB — IRON, TOTAL/TOTAL IRON BINDING CAP
%SAT: 24 % (calc) (ref 16–45)
IRON: 94 ug/dL (ref 40–190)
TIBC: 396 mcg/dL (calc) (ref 250–450)

## 2018-01-16 LAB — VITAMIN B12: Vitamin B-12: 375 pg/mL (ref 200–1100)

## 2018-01-16 LAB — VITAMIN D 25 HYDROXY (VIT D DEFICIENCY, FRACTURES): Vit D, 25-Hydroxy: 32 ng/mL (ref 30–100)

## 2018-01-16 LAB — URINALYSIS, ROUTINE W REFLEX MICROSCOPIC
BILIRUBIN URINE: NEGATIVE
Glucose, UA: NEGATIVE
Hgb urine dipstick: NEGATIVE
Ketones, ur: NEGATIVE
Leukocytes, UA: NEGATIVE
Nitrite: NEGATIVE
Protein, ur: NEGATIVE
Specific Gravity, Urine: 1.028 (ref 1.001–1.03)
pH: <=5 (ref 5.0–8.0)

## 2018-01-16 LAB — TSH: TSH: 0.95 mIU/L

## 2018-01-16 LAB — THYROID PEROXIDASE ANTIBODY: THYROID PEROXIDASE ANTIBODY: 1 [IU]/mL (ref <9)

## 2018-01-16 LAB — MAGNESIUM: Magnesium: 2.2 mg/dL (ref 1.5–2.5)

## 2018-01-16 LAB — THYROGLOBULIN ANTIBODY: Thyroglobulin Ab: <1 IU/mL (ref <=1)

## 2018-05-13 ENCOUNTER — Ambulatory Visit: Payer: Self-pay | Admitting: Physician Assistant

## 2018-09-01 ENCOUNTER — Other Ambulatory Visit: Payer: Self-pay

## 2018-09-01 ENCOUNTER — Ambulatory Visit: Payer: No Typology Code available for payment source | Admitting: Adult Health

## 2018-09-01 ENCOUNTER — Encounter: Payer: Self-pay | Admitting: Adult Health

## 2018-09-01 VITALS — BP 120/80 | HR 70 | Temp 97.2°F | Wt 130.4 lb

## 2018-09-01 DIAGNOSIS — G43831 Menstrual migraine, intractable, with status migrainosus: Secondary | ICD-10-CM

## 2018-09-01 DIAGNOSIS — G43829 Menstrual migraine, not intractable, without status migrainosus: Secondary | ICD-10-CM | POA: Insufficient documentation

## 2018-09-01 MED ORDER — PROMETHAZINE HCL 25 MG/ML IJ SOLN
25.0000 mg | Freq: Once | INTRAMUSCULAR | Status: AC
  Administered 2018-09-01: 13:00:00 25 mg via INTRAMUSCULAR

## 2018-09-01 MED ORDER — KETOROLAC TROMETHAMINE 60 MG/2ML IM SOLN
60.0000 mg | Freq: Once | INTRAMUSCULAR | Status: AC
  Administered 2018-09-01: 60 mg via INTRAMUSCULAR

## 2018-09-01 MED ORDER — RIZATRIPTAN BENZOATE 10 MG PO TABS: 10.0000 mg | ORAL_TABLET | ORAL | 2 refills | Status: DC | PRN

## 2018-09-01 NOTE — Patient Instructions (Addendum)
Start ibuprofen or aleve as soon as headache pain starts if you know you are close to your menstrual cycle  Consider getting on a daily magnesium supplement (250 mg-400 mg daily) - take with food to limit diarrhea or other GI side effects    Migraine Headache A migraine headache is an intense, throbbing pain on one side or both sides of the head. Migraine headaches may also cause other symptoms, such as nausea, vomiting, and sensitivity to light and noise. A migraine headache can last from 4 hours to 3 days. Talk with your doctor about what things may bring on (trigger) your migraine headaches. What are the causes? The exact cause of this condition is not known. However, a migraine may be caused when nerves in the brain become irritated and release chemicals that cause inflammation of blood vessels. This inflammation causes pain. This condition may be triggered or caused by:  Drinking alcohol.  Smoking.  Taking medicines, such as: ? Medicine used to treat chest pain (nitroglycerin). ? Birth control pills. ? Estrogen. ? Certain blood pressure medicines.  Eating or drinking products that contain nitrates, glutamate, aspartame, or tyramine. Aged cheeses, chocolate, or caffeine may also be triggers.  Doing physical activity. Other things that may trigger a migraine headache include:  Menstruation.  Pregnancy.  Hunger.  Stress.  Lack of sleep or too much sleep.  Weather changes.  Fatigue. What increases the risk? The following factors may make you more likely to experience migraine headaches:  Being a certain age. This condition is more common in people who are 2725-38 years old.  Being female.  Having a family history of migraine headaches.  Being Caucasian.  Having a mental health condition, such as depression or anxiety.  Being obese. What are the signs or symptoms? The main symptom of this condition is pulsating or throbbing pain. This pain may:  Happen in any  area of the head, such as on one side or both sides.  Interfere with daily activities.  Get worse with physical activity.  Get worse with exposure to bright lights or loud noises. Other symptoms may include:  Nausea.  Vomiting.  Dizziness.  General sensitivity to bright lights, loud noises, or smells. Before you get a migraine headache, you may get warning signs (an aura). An aura may include:  Seeing flashing lights or having blind spots.  Seeing bright spots, halos, or zigzag lines.  Having tunnel vision or blurred vision.  Having numbness or a tingling feeling.  Having trouble talking.  Having muscle weakness. Some people have symptoms after a migraine headache (postdromal phase), such as:  Feeling tired.  Difficulty concentrating. How is this diagnosed? A migraine headache can be diagnosed based on:  Your symptoms.  A physical exam.  Tests, such as: ? CT scan or an MRI of the head. These imaging tests can help rule out other causes of headaches. ? Taking fluid from the spine (lumbar puncture) and analyzing it (cerebrospinal fluid analysis, or CSF analysis). How is this treated? This condition may be treated with medicines that:  Relieve pain.  Relieve nausea.  Prevent migraine headaches. Treatment for this condition may also include:  Acupuncture.  Lifestyle changes like avoiding foods that trigger migraine headaches.  Biofeedback.  Cognitive behavioral therapy. Follow these instructions at home: Medicines  Take over-the-counter and prescription medicines only as told by your health care provider.  Ask your health care provider if the medicine prescribed to you: ? Requires you to avoid driving or using heavy  machinery. ? Can cause constipation. You may need to take these actions to prevent or treat constipation:  Drink enough fluid to keep your urine pale yellow.  Take over-the-counter or prescription medicines.  Eat foods that are high in  fiber, such as beans, whole grains, and fresh fruits and vegetables.  Limit foods that are high in fat and processed sugars, such as fried or sweet foods. Lifestyle  Do not drink alcohol.  Do not use any products that contain nicotine or tobacco, such as cigarettes, e-cigarettes, and chewing tobacco. If you need help quitting, ask your health care provider.  Get at least 8 hours of sleep every night.  Find ways to manage stress, such as meditation, deep breathing, or yoga. General instructions      Keep a journal to find out what may trigger your migraine headaches. For example, write down: ? What you eat and drink. ? How much sleep you get. ? Any change to your diet or medicines.  If you have a migraine headache: ? Avoid things that make your symptoms worse, such as bright lights. ? It may help to lie down in a dark, quiet room. ? Do not drive or use heavy machinery. ? Ask your health care provider what activities are safe for you while you are experiencing symptoms.  Keep all follow-up visits as told by your health care provider. This is important. Contact a health care provider if:  You develop symptoms that are different or more severe than your usual migraine headache symptoms.  You have more than 15 headache days in one month. Get help right away if:  Your migraine headache becomes severe.  Your migraine headache lasts longer than 72 hours.  You have a fever.  You have a stiff neck.  You have vision loss.  Your muscles feel weak or like you cannot control them.  You start to lose your balance often.  You have trouble walking.  You faint.  You have a seizure. Summary  A migraine headache is an intense, throbbing pain on one side or both sides of the head. Migraines may also cause other symptoms, such as nausea, vomiting, and sensitivity to light and noise.  This condition may be treated with medicines and lifestyle changes. You may also need to avoid  certain things that trigger a migraine headache.  Keep a journal to find out what may trigger your migraine headaches.  Contact your health care provider if you have more than 15 headache days in a month or you develop symptoms that are different or more severe than your usual migraine headache symptoms. This information is not intended to replace advice given to you by your health care provider. Make sure you discuss any questions you have with your health care provider. Document Released: 01/22/2005 Document Revised: 05/16/2018 Document Reviewed: 03/06/2018 Elsevier Patient Education  2020 Elsevier Inc.     Rizatriptan tablets What is this medicine? RIZATRIPTAN (rye za TRIP tan) is used to treat migraines with or without aura. An aura is a strange feeling or visual disturbance that warns you of an attack. It is not used to prevent migraines. This medicine may be used for other purposes; ask your health care provider or pharmacist if you have questions. COMMON BRAND NAME(S): Maxalt What should I tell my health care provider before I take this medicine? They need to know if you have any of these conditions:  cigarette smoker  circulation problems in fingers and toes  diabetes  heart disease  high blood pressure  high cholesterol  history of irregular heartbeat  history of stroke  kidney disease  liver disease  stomach or intestine problems  an unusual or allergic reaction to rizatriptan, other medicines, foods, dyes, or preservatives  pregnant or trying to get pregnant  breast-feeding How should I use this medicine? Take this medicine by mouth with a glass of water. Follow the directions on the prescription label. Do not take it more often than directed. Talk to your pediatrician regarding the use of this medicine in children. While this drug may be prescribed for children as young as 6 years for selected conditions, precautions do apply. Overdosage: If you think  you have taken too much of this medicine contact a poison control center or emergency room at once. NOTE: This medicine is only for you. Do not share this medicine with others. What if I miss a dose? This does not apply. This medicine is not for regular use. What may interact with this medicine? Do not take this medicine with any of the following medicines:  certain medicines for migraine headache like almotriptan, eletriptan, frovatriptan, naratriptan, rizatriptan, sumatriptan, zolmitriptan  ergot alkaloids like dihydroergotamine, ergonovine, ergotamine, methylergonovine  MAOIs like Carbex, Eldepryl, Marplan, Nardil, and Parnate This medicine may also interact with the following medications:  certain medicines for depression, anxiety, or psychotic disorders  propranolol This list may not describe all possible interactions. Give your health care provider a list of all the medicines, herbs, non-prescription drugs, or dietary supplements you use. Also tell them if you smoke, drink alcohol, or use illegal drugs. Some items may interact with your medicine. What should I watch for while using this medicine? Visit your healthcare professional for regular checks on your progress. Tell your healthcare professional if your symptoms do not start to get better or if they get worse. You may get drowsy or dizzy. Do not drive, use machinery, or do anything that needs mental alertness until you know how this medicine affects you. Do not stand up or sit up quickly, especially if you are an older patient. This reduces the risk of dizzy or fainting spells. Alcohol may interfere with the effect of this medicine. Your mouth may get dry. Chewing sugarless gum or sucking hard candy and drinking plenty of water may help. Contact your healthcare professional if the problem does not go away or is severe. If you take migraine medicines for 10 or more days a month, your migraines may get worse. Keep a diary of headache  days and medicine use. Contact your healthcare professional if your migraine attacks occur more frequently. What side effects may I notice from receiving this medicine? Side effects that you should report to your doctor or health care professional as soon as possible:  allergic reactions like skin rash, itching or hives, swelling of the face, lips, or tongue  chest pain or chest tightness  signs and symptoms of a dangerous change in heartbeat or heart rhythm like chest pain; dizziness; fast, irregular heartbeat; palpitations; feeling faint or lightheaded; falls; breathing problems  signs and symptoms of a stroke like changes in vision; confusion; trouble speaking or understanding; severe headaches; sudden numbness or weakness of the face, arm or leg; trouble walking; dizziness; loss of balance or coordination  signs and symptoms of serotonin syndrome like irritable; confusion; diarrhea; fast or irregular heartbeat; muscle twitching; stiff muscles; trouble walking; sweating; high fever; seizures; chills; vomiting Side effects that usually do not require medical attention (report to your doctor or  health care professional if they continue or are bothersome):  diarrhea  dizziness  drowsiness  dry mouth  headache  nausea, vomiting  pain, tingling, numbness in the hands or feet  stomach pain This list may not describe all possible side effects. Call your doctor for medical advice about side effects. You may report side effects to FDA at 1-800-FDA-1088. Where should I keep my medicine? Keep out of the reach of children. Store at room temperature between 15 and 30 degrees C (59 and 86 degrees F). Keep container tightly closed. Throw away any unused medicine after the expiration date. NOTE: This sheet is a summary. It may not cover all possible information. If you have questions about this medicine, talk to your doctor, pharmacist, or health care provider.  2020 Elsevier/Gold Standard  (2017-08-06 14:59:59)

## 2018-09-01 NOTE — Progress Notes (Signed)
Assessment and Plan:  Ashlee Pena was seen today for migraine.  Diagnoses and all orders for this visit:  Intractable menstrual migraine with status migrainosus Menstrual migraine- Lie in darkened room and apply cold packs as needed for pain. Episodic therapy: NSAIDs and triptan therapy due to low frequency of pain. Side effect profile discussed in detail. Patient reassured that neurodiagnostic workup not indicated from benign H&P.  Will go to the ER if worsening headache, changes vision/speech, imbalance, weakness. Will trial alternate abortive agent due to lack of full resolution with imitrex; reviewed and information provided on AVS; follow up with Ashlee Pena if abortive agents aren't working well  May be a candidate for trial of nurtec ODT if fails triptan x 2 She tolerated migraine cocktail well with observation x 15 min here in office -     ketorolac (TORADOL) injection 60 mg -     promethazine (PHENERGAN) injection 25 mg -     rizatriptan (MAXALT) 10 MG tablet; Take 1 tablet (10 mg total) by mouth as needed for migraine. May repeat in 2 hours if needed  Further disposition pending results of labs. Discussed med's effects and SE's.   Over 20 minutes of exam, counseling, chart review, and critical decision making was performed.   Future Appointments  Date Time Provider [place]  01/21/2019 10:00 AM Vicie Mutters, PA-C [facility] None    ------------------------------------------------------------------------------------------------------------------   HPI BP 120/80   Pulse 70   Temp (!) 97.2 F (36.2 C)   Wt 130 lb 6.4 oz (59.1 kg)   SpO2 98%   BMI 21.70 kg/m   38 y.o.female with reported hx of migraines presents for headache x 3 days.   She reports hadn't had in the last 2 months, but typically does have prior to menstrual cycle (due to start in 2 days), had migraine start 2 days ago in the evening, has been out of typical abortives (imitrex 100 mg- new med trialing  since CPE last year), not on prophylactic agent due to low frequency of migraines (has discussed with Ashlee Bamberg, PA at our office).   She has been prescribed imitrex for abortive but ran out and hadn't refilled due to not having bad migraines in the last few months. Admits this abortive helps some but doesn't necessarily get rid of migraine. She is interested in trialing alternative agent.   She reports pain is persistent, 5-6/10, constant, typical migraine pain for her, bilateral, no nausea at this time but has had yesterday, does have photosensitivity though improved some at this time after lying in cool dark room all day yesterday. Denies aura. Denies fever/chills, stiff neck, cough, sore throat, blurry vision or dizziness. She has been taking ibuprofen or aleve for mild migraines but also was out of this.   She was driven by her friend today.   No past medical history on file.   No Known Allergies  Current Outpatient Medications on File Prior to Visit  Medication Sig  . etonogestrel-ethinyl estradiol (NUVARING) 0.12-0.015 MG/24HR vaginal ring INSERT 1 RING VAGINALLY FOR 3 WEEKS THEN REMOVE FOR 1 WEEK  . Ibuprofen (ADVIL) 200 MG CAPS Take 200 mg by mouth as needed.  . SUMAtriptan (IMITREX) 100 MG tablet Take 1 tablet (100 mg total) by mouth once as needed for migraine. May repeat in 2 hours if headache persists or recurs. (Patient not taking: Reported on 09/01/2018)   No current facility-administered medications on file prior to visit.     ROS: all negative except above.   Physical  Exam:  BP 120/80   Pulse 70   Temp (!) 97.2 F (36.2 C)   Wt 130 lb 6.4 oz (59.1 kg)   SpO2 98%   BMI 21.70 kg/m   General Appearance: Well nourished, in no acute distress. Eyes: PERRLA, EOMs, conjunctiva no swelling or erythema Sinuses: No Frontal/maxillary tenderness ENT/Mouth: No erythema, swelling, or exudate on post pharynx.  Tonsils not swollen or erythematous. Hearing normal.  Neck:  Supple Respiratory: Respiratory effort normal, BS equal bilaterally without rales, rhonchi, wheezing or stridor.  Cardio: RRR with no MRGs. Brisk peripheral pulses without edema.  Abdomen: Soft, + BS.  Non tender. Lymphatics: Non tender without lymphadenopathy.  Musculoskeletal: Symmetrical strength, normal gait.  Skin: Warm, dry without rashes, lesions, ecchymosis.  Neuro: Cranial nerves intact. Normal muscle tone, no cerebellar symptoms. Sensation intact.  Psych: Awake and oriented X 3, normal affect, Insight and Judgment appropriate.     Dan MakerAshley C Peighton Edgin, NP 12:51 PM [facility]

## 2018-09-03 ENCOUNTER — Telehealth: Payer: Self-pay

## 2018-09-03 NOTE — Telephone Encounter (Signed)
Patient was seen on Monday and treated for a migraine. She has been taking the medication prescribed but they are making her feel "woozy". Her migraine was better yesterday but she woke up with it again this morning, not as bad as it was but is concerned. Please advise.

## 2018-09-03 NOTE — Telephone Encounter (Signed)
Not taking any ibuprofen or aleve at this time. No new symptoms. When speaking to her, she feels a lot better just has the feeling that it exists in the back of her mind. Not ready to try something new and will call us if she is not better.

## 2019-01-16 NOTE — Progress Notes (Deleted)
Complete Physical  Assessment and Plan:  Routine general medical examination at a health care facility 1 year  Vitamin D deficiency -     VITAMIN D 25 Hydroxy (Vit-D Deficiency, Fractures)  Screening for blood or protein in urine -     Urinalysis, Routine w reflex microscopic  Medication management -     CBC with Differential/Platelet -     COMPLETE METABOLIC PANEL WITH GFR -     Magnesium  Need for diphtheria-tetanus-pertussis (Tdap) vaccine -     Tdap vaccine greater than or equal to 7yo IM  Family history of cardiovascular disease -     Lipid panel - very concerned for own health, father passed suddenly at 31, he had just run 2 miles, seemed healthy - will do aggressive risk control - will start to do plant based diet - may get advanced lipids in the future  Family history of thyroid disease -     TSH -     Thyroglobulin antibody -     Thyroid peroxidase antibody  Screening, anemia, deficiency, iron -     Iron,Total/Total Iron Binding Cap -     Vitamin B12  Other migraine without status migrainosus, not intractable -     SUMAtriptan (IMITREX) 100 MG tablet; Take 1 tablet (100 mg total) by mouth once as needed for migraine. May repeat in 2 hours if headache persists or recurs. - no red flags with it, no vision changes, etc Will go to the ER if worsening headache, changes vision/speech, imbalance, weakness.  Fatigue, unspecified type Check labs Get on B12 ? Sleep apnea- she has fatigue, frequent awakenings especially if on her back- declines at this time due to money but will keep in mind in the future.  ? Part grief, continue to work out, eat well - 3 month follow up  Discussed med's effects and SE's. Screening labs and tests as requested with regular follow-up as recommended.  HPI  38 y.o. female  presents for a complete physical.  She has had a cold x thanksgiving, feels she is improving but stlil not feeling well.   Father passed unexpectedly at 43 from  MI. She states she has never felt good but this year she is really struggling with it this year. She has had increased migraines, once a month with her menses. She states she wakes up tired, tosses and turns a lot, she falls asleeps well, has good bed time routine, but will wake up. She is not taking her vitamin D. She states she knows she snores/drools, she will wake up if she sleeps on her back, has to sleep on her side. She is very tearful, she also filed for bankruptcy this year. 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   Her B12 was 322.  Mom now treated for thyroid at 71.   Her blood pressure has been controlled at home, today their BP is   She does workout. She denies chest pain, shortness of breath, dizziness.  She is not on cholesterol medication and denies myalgias. Her cholesterol is at goal. 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 Is doing [employer] for allergies. Has part time job for young [employer], part of [employer].  She is not on Nuvaring but not sexually active.  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  BMI is There is no height or weight on file to calculate BMI., she is working on diet and exercise. Wt Readings from Last 3 Encounters:  09/01/18 130 lb 6.4 oz (59.1 kg)  01/15/18 132 lb 9.6 oz (60.1 kg)  01/07/17 132 lb 12.8 oz (60.2 kg)     Current Medications:  Current Outpatient Medications on File Prior to Visit  Medication Sig  . etonogestrel-ethinyl  estradiol (NUVARING) 0.12-0.015 MG/24HR vaginal ring INSERT 1 RING VAGINALLY FOR 3 WEEKS THEN REMOVE FOR 1 WEEK  . Ibuprofen (ADVIL) 200 MG CAPS Take 200 mg by mouth as needed.  . rizatriptan (MAXALT) 10 MG tablet Take 1 tablet (10 mg total) by mouth as needed for migraine. May repeat in 2 hours if needed   No current facility-administered medications on file prior to visit.   Health Maintenance:   Immunization History  Administered Date(s) Administered  . HPV Quadrivalent 07/19/2005, 10/09/2005, 01/17/2006  . Tdap 09/17/2007, 01/15/2018   Tetanus:2009- wants to wait due to high deductible.  Pneumovax: Flu vaccine: declines Gardasil X 3 last one 2007 Zostavax:  LMP: Yesterday Pap: normal PAP 08/30/2016 neg HPV- due 2021 MGM:  DEXA: Colonoscopy: EGD: CT head 2011  Patient Care Team: Lucky CowboyMcKeown, William, MD as PCP - General (Internal Medicine)  Medical History: No past medical history on file.   Allergies No Known Allergies  SURGICAL HISTORY She  has a past surgical history that includes Closed reduction clavicle fracture (Right, 2011).   FAMILY HISTORY Her family history includes Cancer in her father and paternal grandfather; Heart attack in her paternal grandfather; Heart disease (age of onset: 6264) in her father; Hypertension in her paternal grandfather; Stroke in her paternal grandfather.   SOCIAL HISTORY She  reports that she has never smoked. She has never used smokeless tobacco. She reports current alcohol use. She reports that she does not use drugs.   Review of Systems  Constitutional: Positive for malaise/fatigue. Negative for chills, diaphoresis, fever and weight loss.  HENT: Negative for congestion, ear discharge, ear pain, hearing loss, nosebleeds, sore throat and tinnitus.   Respiratory: Negative.  Negative for stridor.   Cardiovascular: Negative.   Gastrointestinal: Negative.   Genitourinary: Negative.   Musculoskeletal: Negative.  Negative for joint pain  and myalgias.  Skin: Negative.   Neurological: Positive for headaches. Negative for dizziness, tingling, tremors, sensory change, speech change, focal weakness, seizures, loss of consciousness and weakness.  Psychiatric/Behavioral: Negative for depression, hallucinations, memory loss, substance abuse and suicidal ideas. The patient has insomnia. The patient is not nervous/anxious.      Physical Exam: Estimated body mass index is 21.7 kg/m as calculated from the following:   Height as of 01/15/18: 5\' 5"  (1.651 m).   Weight as of 09/01/18: 130 lb 6.4 oz (59.1 kg). There were no vitals taken for this visit. General Appearance: Well nourished, in no apparent distress.  Eyes: PERRLA, EOMs, conjunctiva no swelling or erythema, normal fundi and vessels.  Sinuses: No Frontal/maxillary tenderness  ENT/Mouth: Ext aud canals clear, normal light reflex with  TMs without erythema, bulging. Good dentition. No erythema, swelling, or exudate on post pharynx. Tonsils not swollen or erythematous. Hearing normal.  Neck: Supple, thyroid normal. No bruits  Respiratory: Respiratory effort normal, BS equal bilaterally without rales, rhonchi, wheezing or stridor.  Cardio: RRR without murmurs, rubs or gallops. Brisk peripheral pulses without edema.  Chest: symmetric, with normal excursions and percussion.  Breasts: Symmetric, without lumps, nipple discharge, retractions.  Abdomen: Soft, nontender, no guarding, rebound, hernias, masses, or organomegaly. .  Lymphatics: Non tender without lymphadenopathy.  Genitourinary: defer next year Musculoskeletal: Full ROM all peripheral extremities,5/5 strength, and normal gait.  Skin: Warm, dry without rashes, lesions, ecchymosis. Neuro: Cranial nerves intact, reflexes equal bilaterally. Normal muscle tone, no cerebellar symptoms. Sensation intact.  Psych: Awake and oriented X 3, normal affect, Insight and Judgment appropriate.   EKG: in paper chart, need to scan   Quentin Mulling 12:05 PM [facility]

## 2019-01-21 ENCOUNTER — Encounter: Payer: Self-pay | Admitting: Physician Assistant

## 2019-02-13 ENCOUNTER — Encounter: Payer: Self-pay | Admitting: Physician Assistant

## 2019-02-16 NOTE — Progress Notes (Signed)
Complete Physical  Assessment and Plan:  Encounter for general adult medical examination with abnormal findings 1 year  Intractable menstrual migraine with status migrainosus Improved off BCP  Vitamin D deficiency -     Vitamin D (25 hydroxy)  Screening for blood or protein in urine -     Urinalysis, Routine w reflex microscopic -     Microalbumin / Creatinine Urine Ratio  Medication management -     CBC with Diff -     COMPLETE METABOLIC PANEL WITH GFR -     Magnesium  Family history of thyroid disease -     TSH  Screening, anemia, deficiency, iron -     Iron,Total/Total Iron Binding Cap -     Ferritin -     Vitamin B12  Screening for cervical cancer -     Ambulatory referral to Gynecology Since patient is going to GYN to discuss possible IUD insertion, will have GYN get PAP, patient is due this year  Birth control counseling -     Ambulatory referral to Gynecology - will discuss non hormonal birth control like paraguard.   Screening cholesterol level -     Lipid Profile    Discussed med's effects and SE's. Screening labs and tests as requested with regular follow-up as recommended.  HPI  39 y.o. female  presents for a complete physical.  She has an area on her right nipple that she wants looked at, has been there x 1 month.   Patient got out of a bad relationship this summer, she got off the NUVAring and states her migraines have gotten, she feels better. She is interested in a non hormonal birth control. She also had 1 day of bleeding after intercourse. She is due for PAP this year, negative HPV 2018.   Father passed unexpectedly at 24 from MI 2019  She has had chronic fatigue but this has improved. She is taking her vitamin D daily. She states she feels she has been able to deal with her anxiety better and this has helped. She is very tearful, she also filed for bankruptcy last year. 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   She is on B12 and  folic acid combo Lab Results  Component Value Date   VITAMINB12 375 01/15/2018   She is not on biotin, mom being treated for hypothyroidism. Lab Results  Component Value Date   TSH 0.95 01/15/2018   Her blood pressure has been controlled at home, today their BP is BP: 118/70 She does workout, she is running 50-100 miles a month. She denies chest pain, shortness of breath, dizziness.  She is not on cholesterol medication and denies myalgias. Her cholesterol is at goal. 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  BMI is Body mass index is 21.4 kg/m., she is working on diet and exercise. Wt Readings from Last 3 Encounters:  02/18/19  128 lb 9.6 oz (58.3 kg)  09/01/18 130 lb 6.4 oz (59.1 kg)  01/15/18 132 lb 9.6 oz (60.1 kg)     Current Medications:  Current Outpatient Medications on File Prior to Visit  Medication Sig  . Ibuprofen (ADVIL) 200 MG CAPS Take 200 mg by mouth as needed.  . rizatriptan (MAXALT) 10 MG tablet Take 1 tablet (10 mg total) by mouth as needed for migraine. May repeat in 2 hours if needed  . etonogestrel-ethinyl estradiol (NUVARING) 0.12-0.015 MG/24HR vaginal ring INSERT 1 RING VAGINALLY FOR 3 WEEKS THEN REMOVE FOR 1 WEEK (Patient not taking: Reported on 02/18/2019)   No current facility-administered medications on file prior to visit.   Health Maintenance:   Immunization History  Administered Date(s) Administered  . HPV Quadrivalent 07/19/2005, 10/09/2005, 01/17/2006  . Tdap 09/17/2007,  01/15/2018   Tetanus:2019 Pneumovax: Flu vaccine: declines Gardasil X 3 last one 2007 Zostavax:  LMP: Yesterday Pap: normal PAP 08/30/2016 neg HPV- due 2021 MGM:  DEXA: Colonoscopy: EGD: CT head 2011  Patient Care Team: Lucky Cowboy, MD as PCP - General (Internal Medicine)  Medical History: No past medical history on file.   Allergies No Known Allergies  SURGICAL HISTORY She  has a past surgical history that includes Closed reduction clavicle fracture (Right, 2011).   FAMILY HISTORY Her family history includes Cancer in her father and paternal grandfather; Heart attack in her paternal grandfather; Heart disease (age of onset: 89) in her father; Hypertension in her paternal grandfather; Stroke in her paternal grandfather.   SOCIAL HISTORY She  reports that she has never smoked. She has never used smokeless tobacco. She reports current alcohol use. She reports that she does not use drugs.   Review of Systems  Constitutional: Negative for chills, diaphoresis, fever, malaise/fatigue and weight loss.  HENT: Negative for congestion, ear discharge, ear pain, hearing loss, nosebleeds, sore throat and tinnitus.   Respiratory: Negative.  Negative for stridor.   Cardiovascular: Negative.   Gastrointestinal: Negative.   Genitourinary: Negative.   Musculoskeletal: Negative.  Negative for joint pain and myalgias.  Skin: Negative.   Neurological: Negative for dizziness, tingling, tremors, sensory change, speech change, focal weakness, seizures, loss of consciousness, weakness and headaches.  Psychiatric/Behavioral: Negative for depression, hallucinations, memory loss, substance abuse and suicidal ideas. The patient is not nervous/anxious and does not have insomnia.      Physical Exam: Estimated body mass index is 21.4 kg/m as calculated from the following:   Height as of this encounter: 5\' 5"  (1.651 m).   Weight as of this encounter: 128 lb 9.6 oz (58.3 kg). BP 118/70    Pulse 80   Temp 97.9 F (36.6 C)   Ht 5\' 5"  (1.651 m)   Wt 128 lb 9.6 oz (58.3 kg)   LMP 01/28/2019   SpO2 98%   BMI 21.40 kg/m  General Appearance: Well nourished, in no apparent distress.  Eyes: PERRLA, EOMs, conjunctiva no swelling or erythema, normal fundi and vessels.  Sinuses: No Frontal/maxillary tenderness  ENT/Mouth: Ext aud canals clear, normal light reflex with TMs without erythema, bulging. Good dentition. No erythema, swelling, or exudate on post pharynx. Tonsils not swollen or erythematous. Hearing normal.  Neck: Supple, thyroid normal. No bruits  Respiratory: Respiratory effort normal, BS equal bilaterally without rales, rhonchi, wheezing or stridor.  Cardio: RRR without murmurs, rubs or gallops. Brisk peripheral pulses without edema.  Chest: symmetric, with normal excursions and percussion.  Breasts: Symmetric, mobile known lump right breast at 3 oclock,  unchanged, no other lumps, nipple discharge, retractions.  Abdomen: Soft, nontender, no guarding, rebound, hernias, masses, or organomegaly. .  Lymphatics: Non tender without lymphadenopathy.  Genitourinary: defer to GYN Musculoskeletal: Full ROM all peripheral extremities,5/5 strength, and normal gait.  Skin: Warm, dry without rashes, lesions, ecchymosis. Neuro: Cranial nerves intact, reflexes equal bilaterally. Normal muscle tone, no cerebellar symptoms. Sensation intact.  Psych: Awake and oriented X 3, normal affect, Insight and Judgment appropriate.   EKG: in paper chart, need to scan   Vicie Mutters 10:10 AM [facility]

## 2019-02-18 ENCOUNTER — Ambulatory Visit (INDEPENDENT_AMBULATORY_CARE_PROVIDER_SITE_OTHER): Payer: 59 | Admitting: Physician Assistant

## 2019-02-18 ENCOUNTER — Encounter: Payer: Self-pay | Admitting: Physician Assistant

## 2019-02-18 ENCOUNTER — Other Ambulatory Visit: Payer: Self-pay

## 2019-02-18 VITALS — BP 118/70 | HR 80 | Temp 97.9°F | Ht 65.0 in | Wt 128.6 lb

## 2019-02-18 DIAGNOSIS — Z130 Encounter for screening for diseases of the blood and blood-forming organs and certain disorders involving the immune mechanism: Secondary | ICD-10-CM

## 2019-02-18 DIAGNOSIS — Z3009 Encounter for other general counseling and advice on contraception: Secondary | ICD-10-CM

## 2019-02-18 DIAGNOSIS — Z0001 Encounter for general adult medical examination with abnormal findings: Secondary | ICD-10-CM

## 2019-02-18 DIAGNOSIS — Z79899 Other long term (current) drug therapy: Secondary | ICD-10-CM

## 2019-02-18 DIAGNOSIS — Z13220 Encounter for screening for lipoid disorders: Secondary | ICD-10-CM | POA: Diagnosis not present

## 2019-02-18 DIAGNOSIS — Z8349 Family history of other endocrine, nutritional and metabolic diseases: Secondary | ICD-10-CM

## 2019-02-18 DIAGNOSIS — Z0000 Encounter for general adult medical examination without abnormal findings: Secondary | ICD-10-CM

## 2019-02-18 DIAGNOSIS — E559 Vitamin D deficiency, unspecified: Secondary | ICD-10-CM | POA: Diagnosis not present

## 2019-02-18 DIAGNOSIS — Z1329 Encounter for screening for other suspected endocrine disorder: Secondary | ICD-10-CM | POA: Diagnosis not present

## 2019-02-18 DIAGNOSIS — Z1389 Encounter for screening for other disorder: Secondary | ICD-10-CM

## 2019-02-18 DIAGNOSIS — G43831 Menstrual migraine, intractable, with status migrainosus: Secondary | ICD-10-CM

## 2019-02-18 DIAGNOSIS — Z124 Encounter for screening for malignant neoplasm of cervix: Secondary | ICD-10-CM

## 2019-02-18 NOTE — Patient Instructions (Signed)

## 2019-02-19 LAB — URINALYSIS, ROUTINE W REFLEX MICROSCOPIC
Bilirubin Urine: NEGATIVE
Glucose, UA: NEGATIVE
Hgb urine dipstick: NEGATIVE
Ketones, ur: NEGATIVE
Leukocytes,Ua: NEGATIVE
Nitrite: NEGATIVE
Protein, ur: NEGATIVE
Specific Gravity, Urine: 1.003 (ref 1.001–1.03)
pH: 6.5 (ref 5.0–8.0)

## 2019-02-19 LAB — CBC WITH DIFFERENTIAL/PLATELET
Absolute Monocytes: 371 cells/uL (ref 200–950)
Basophils Absolute: 12 cells/uL (ref 0–200)
Basophils Relative: 0.2 %
Eosinophils Absolute: 29 cells/uL (ref 15–500)
Eosinophils Relative: 0.5 %
HCT: 44 % (ref 35.0–45.0)
Hemoglobin: 14.9 g/dL (ref 11.7–15.5)
Lymphs Abs: 1833 cells/uL (ref 850–3900)
MCH: 32.3 pg (ref 27.0–33.0)
MCHC: 33.9 g/dL (ref 32.0–36.0)
MCV: 95.2 fL (ref 80.0–100.0)
MPV: 12.2 fL (ref 7.5–12.5)
Monocytes Relative: 6.4 %
Neutro Abs: 3555 cells/uL (ref 1500–7800)
Neutrophils Relative %: 61.3 %
Platelets: 179 10*3/uL (ref 140–400)
RBC: 4.62 10*6/uL (ref 3.80–5.10)
RDW: 13.4 % (ref 11.0–15.0)
Total Lymphocyte: 31.6 %
WBC: 5.8 10*3/uL (ref 3.8–10.8)

## 2019-02-19 LAB — COMPLETE METABOLIC PANEL WITH GFR
AG Ratio: 2.1 (calc) (ref 1.0–2.5)
ALT: 28 U/L (ref 6–29)
AST: 18 U/L (ref 10–30)
Albumin: 4.8 g/dL (ref 3.6–5.1)
Alkaline phosphatase (APISO): 59 U/L (ref 31–125)
BUN: 9 mg/dL (ref 7–25)
CO2: 27 mmol/L (ref 20–32)
Calcium: 9.7 mg/dL (ref 8.6–10.2)
Chloride: 102 mmol/L (ref 98–110)
Creat: 0.65 mg/dL (ref 0.50–1.10)
GFR, Est African American: 131 mL/min/{1.73_m2} (ref >=60)
GFR, Est Non African American: 113 mL/min/{1.73_m2} (ref >=60)
Globulin: 2.3 g/dL (calc) (ref 1.9–3.7)
Glucose, Bld: 84 mg/dL (ref 65–99)
Potassium: 4.3 mmol/L (ref 3.5–5.3)
Sodium: 137 mmol/L (ref 135–146)
Total Bilirubin: 0.6 mg/dL (ref 0.2–1.2)
Total Protein: 7.1 g/dL (ref 6.1–8.1)

## 2019-02-19 LAB — IRON, TOTAL/TOTAL IRON BINDING CAP
%SAT: 32 % (calc) (ref 16–45)
Iron: 113 ug/dL (ref 40–190)
TIBC: 348 mcg/dL (calc) (ref 250–450)

## 2019-02-19 LAB — TSH: TSH: 1.11 mIU/L

## 2019-02-19 LAB — LIPID PANEL
Cholesterol: 180 mg/dL (ref <200)
HDL: 72 mg/dL (ref >=50)
LDL Cholesterol (Calc): 92 mg/dL (calc)
Non-HDL Cholesterol (Calc): 108 mg/dL (calc) (ref <130)
Total CHOL/HDL Ratio: 2.5 (calc) (ref <5.0)
Triglycerides: 75 mg/dL (ref <150)

## 2019-02-19 LAB — MICROALBUMIN / CREATININE URINE RATIO
Creatinine, Urine: 12 mg/dL — ABNORMAL LOW (ref 20–275)
Microalb, Ur: <0.2 mg/dL

## 2019-02-19 LAB — VITAMIN B12: Vitamin B-12: 1154 pg/mL — ABNORMAL HIGH (ref 200–1100)

## 2019-02-19 LAB — FERRITIN: Ferritin: 11 ng/mL — ABNORMAL LOW (ref 16–154)

## 2019-02-19 LAB — VITAMIN D 25 HYDROXY (VIT D DEFICIENCY, FRACTURES): Vit D, 25-Hydroxy: 34 ng/mL (ref 30–100)

## 2019-02-19 LAB — MAGNESIUM: Magnesium: 2.1 mg/dL (ref 1.5–2.5)

## 2019-08-28 NOTE — Progress Notes (Signed)
Assessment and Plan:  Ashlee Pena was seen today for medical clearance.  Diagnoses and all orders for this visit:  Encounter for routine adult health examination without abnormal findings Family history of cardiac disorder Screening for cardiovascular condition Running 40 mile marathon; screening EKG for baseline due to some questionable family history  Patient currently runs 20+ miles without difficulty  Borderline QT as per below; no baseline to compare; however tolerating 20+ miles already at this point without concerning sx; cautiously proceed with clearance, will monitor with close follow up for possible QT elongation -     EKG 12-Lead   At risk for prolonged QT interval syndrome ? Family history that wasn't confirmed, borderline QT 488 on EKG today  She is not on any QT prolonging medications Monitor for trend with follow up EKGs over next few screening visits Will make note in chart to avoid QT prolonging agents Contact office if any concerning sx while increasing distance training Go to the ER if any chest pain, shortness of breath, nausea, dizziness -     EKG 12-Lead -     Magnesium -     BASIC METABOLIC PANEL WITH GFR  Further disposition pending results of labs. Discussed med's effects and SE's.   Over 30 minutes of exam, counseling, chart review, and critical decision making was performed.   Future Appointments  Date Time Provider Department Center  09/01/2020 10:00 AM Judd Gaudier, NP [facility] None    ------------------------------------------------------------------------------------------------------------------   HPI BP 108/68   Pulse 57   Temp (!) 97.5 F (36.4 C)   Ht 5' 4.5" (1.638 m)   Wt 129 lb 12.8 oz (58.9 kg)   SpO2 97%   BMI 21.94 kg/m   39 y.o.female presents for EKG, recommended CV screening prior to attempt to run a marathon this year. Her brother had problems with his heart, possible long QT but ? Didn't confirm, dad did pass away suddenly  this year, didn't get autopsy, presumed cardiac etiology, he was marathon runner and "very healthy".   She is here for basline EKG. She is planning 40 mile marathon in Oct. She does note she successfully ran one this past December without difficulty. She has been training regularly, currently running ~40 miles per week, up to 20 miles at a time without concerns. She denies CP, dyspnea, dizziness, palpitations other than sensation of very rare skipped beat.   Today their BP is BP: 108/68  No past medical history on file.   Allergies  Allergen Reactions  . Other Anaphylaxis    Honey bees    Current Outpatient Medications on File Prior to Visit  Medication Sig  . Ascorbic Acid (VITAMIN C) 1000 MG tablet Take 1,000 mg by mouth daily.  . Cholecalciferol (VITAMIN D3) 250 MCG (10000 UT) TABS Take by mouth daily.  . Cyanocobalamin (B-12 PO) Take by mouth daily.  Marland Kitchen EPINEPHrine 0.3 mg/0.3 mL IJ SOAJ injection Inject 0.3 mg into the muscle as needed for anaphylaxis.  . Ibuprofen (ADVIL) 200 MG CAPS Take 200 mg by mouth as needed.  . rizatriptan (MAXALT) 10 MG tablet Take 1 tablet (10 mg total) by mouth as needed for migraine. May repeat in 2 hours if needed   No current facility-administered medications on file prior to visit.    ROS: all negative except above.   Physical Exam:  BP 108/68   Pulse 57   Temp (!) 97.5 F (36.4 C)   Ht 5' 4.5" (1.638 m)   Wt 129 lb  12.8 oz (58.9 kg)   SpO2 97%   BMI 21.94 kg/m   General Appearance: Well nourished, in no apparent distress. Eyes: PERRLA, conjunctiva no swelling or erythema ENT/Mouth: Mask in place; Hearing normal.  Neck: Supple, thyroid normal.  Respiratory: Respiratory effort normal, BS equal bilaterally without rales, rhonchi, wheezing or stridor.  Cardio: RRR with no MRGs. Brisk peripheral pulses without edema.  Abdomen: Soft, + BS.  Non tender, no guarding, rebound, hernias, masses. Lymphatics: Non tender without lymphadenopathy.   Musculoskeletal: Full ROM, 5/5 strength, normal gait.  Skin: Warm, dry without rashes, lesions, ecchymosis.  Neuro: Cranial nerves intact. Normal muscle tone, no cerebellar symptoms. Sensation intact.  Psych: Awake and oriented X 3, normal affect, Insight and Judgment appropriate.    EKG: sinus bradycardia with sinus arrhythmia, borderline QT interval (488)  Dan Maker, NP 11:29 AM [facility]

## 2019-08-31 ENCOUNTER — Other Ambulatory Visit: Payer: Self-pay

## 2019-08-31 ENCOUNTER — Ambulatory Visit: Payer: 59 | Admitting: Adult Health

## 2019-08-31 ENCOUNTER — Encounter: Payer: Self-pay | Admitting: Adult Health

## 2019-08-31 VITALS — BP 108/68 | HR 57 | Temp 97.5°F | Ht 64.5 in | Wt 129.8 lb

## 2019-08-31 DIAGNOSIS — Z8249 Family history of ischemic heart disease and other diseases of the circulatory system: Secondary | ICD-10-CM | POA: Diagnosis not present

## 2019-08-31 DIAGNOSIS — Z9189 Other specified personal risk factors, not elsewhere classified: Secondary | ICD-10-CM | POA: Diagnosis not present

## 2019-08-31 DIAGNOSIS — Z13220 Encounter for screening for lipoid disorders: Secondary | ICD-10-CM | POA: Diagnosis not present

## 2019-08-31 DIAGNOSIS — Z79899 Other long term (current) drug therapy: Secondary | ICD-10-CM | POA: Diagnosis not present

## 2019-08-31 DIAGNOSIS — Z0000 Encounter for general adult medical examination without abnormal findings: Secondary | ICD-10-CM

## 2019-08-31 DIAGNOSIS — Z136 Encounter for screening for cardiovascular disorders: Secondary | ICD-10-CM | POA: Diagnosis not present

## 2019-08-31 DIAGNOSIS — Z6821 Body mass index (BMI) 21.0-21.9, adult: Secondary | ICD-10-CM | POA: Diagnosis not present

## 2019-08-31 LAB — BASIC METABOLIC PANEL WITH GFR
BUN: 15 mg/dL (ref 7–25)
CO2: 27 mmol/L (ref 20–32)
Calcium: 9.4 mg/dL (ref 8.6–10.2)
Chloride: 104 mmol/L (ref 98–110)
Creat: 0.72 mg/dL (ref 0.50–1.10)
GFR, Est African American: 123 mL/min/{1.73_m2} (ref >=60)
GFR, Est Non African American: 106 mL/min/{1.73_m2} (ref >=60)
Glucose, Bld: 81 mg/dL (ref 65–99)
Potassium: 4.4 mmol/L (ref 3.5–5.3)
Sodium: 137 mmol/L (ref 135–146)

## 2019-08-31 LAB — MAGNESIUM: Magnesium: 2.2 mg/dL (ref 1.5–2.5)

## 2019-08-31 NOTE — Patient Instructions (Signed)
Premature Atrial Contraction  A premature atrial contraction [facility]) is a kind of irregular heartbeat (arrhythmia). It happens when the heart beats too early and then pauses before beating again. The heart has four areas, or chambers. Normally, electrical signals spread across the heart and make all the chambers beat together. During a PAC, the upper chambers of the heart (atria) beat too early, before they have had time to fill with blood. The heartbeat pauses afterward so the heart can fill with blood for the next beat. Sometimes PAC can be a warning sign of another type of arrhythmia called atrial fibrillation. Atrial fibrillation may allow blood to pool in the atria and form clots. If a clot travels to the brain, it can cause a stroke. What are the causes? The cause of this condition is often unknown. Sometimes, this condition may be caused by heart disease or injury to the heart. What increases the risk? You are more likely to develop this condition if:  You are a child.  You are an adult who is 74 years of age or older. Episodes may be triggered by:  Caffeine.  Alcohol.  Tobacco use.  Stimulant drugs.  Some medicines or supplements.  Stress.  Heart disease. What are the signs or symptoms? Symptoms of this condition include:  A feeling that your heart skipped a beat. The first heartbeat after the "skipped" beat may feel more forceful.  A feeling that your heart is fluttering. How is this diagnosed? This condition is diagnosed based on:  Your symptoms.  A physical exam. Your health care provider may listen to your heart.  An electrocardiogram (ECG). This is a test that records the electrical impulses of the heart.  An ambulatory cardiac monitor. This device records your heartbeats for 24 hours or more. You may also have:  An echocardiogram to check for any heart conditions. This is a type of imaging test that uses sound waves (ultrasound) to make images of your  heart.  Blood tests. How is this treated? Treatment depends on the frequency of your symptoms and other risk factors. Treatments may include:  Medicines (beta-blockers).  Catheter ablation. This is done to destroy the part of the heart tissue that sends abnormal signals. In some cases, treatment may not be needed for this condition. Follow these instructions at home: Lifestyle  Do not use any products that contain nicotine or tobacco, such as cigarettes, e-cigarettes, and chewing tobacco. If you need help quitting, ask your health care provider.  Exercise regularly. Ask your health care provider what type of exercise is safe for you.  Find healthy ways to manage stress.  Try to get at least 7-9 hours of sleep each night, or as much as recommended by your health care provider. Alcohol use  Do not drink alcohol if: ? Your health care provider tells you not to drink. ? You are pregnant, may be pregnant, or are planning to become pregnant. ? Alcohol triggers your episodes.  If you drink alcohol: ? Limit how much you use to:  0-1 drink a day for women.  0-2 drinks a day for men. ? Be aware of how much alcohol is in your drink. In the [place] one drink equals one 12 oz bottle of beer (355 mL), one 5 oz glass of wine (148 mL), or one 1 oz glass of hard liquor (44 mL). General instructions  Take over-the-counter and prescription medicines only as told by your health care provider.  If caffeine triggers episodes, do not eat,  drink, or use anything with caffeine in it.  Keep all follow-up visits as told by your health care provider. This is important. Contact a health care provider if:  You feel your heart skipping beats.  Your heart skips beats and you feel dizzy, light-headed, or very tired. Get help right away if you have:  Chest pain.  Trouble breathing.  Any symptoms of a stroke. "BE FAST" is an easy way to remember the main warning signs of a stroke. ? B - Balance.  Signs are dizziness, sudden trouble walking, or loss of balance. ? E - Eyes. Signs are trouble seeing or a sudden change in vision. ? F - Face. Signs are sudden weakness or numbness of the face, or the face or eyelid drooping on one side. ? A - Arms. Signs are weakness or numbness in an arm. This happens suddenly and usually on one side of the body. ? S - Speech. Signs are sudden trouble speaking, slurred speech, or trouble understanding what people say. ? T - Time. Time to call emergency services. Write down what time symptoms started.  Other signs of stroke, such as: ? A sudden, severe headache with no known cause. ? Nausea or vomiting. ? Seizure. These symptoms may represent a serious problem that is an emergency. Do not wait to see if the symptoms will go away. Get medical help right away. Call your local emergency services (911 in the [place] Do not drive yourself to the hospital. Summary  A premature atrial contraction [facility]) is a kind of irregular heartbeat (arrhythmia). It happens when the heart beats too early and then pauses before beating again.  Treatment depends on your symptoms and whether you have other underlying heart conditions.  Contact a health care provider if your heart skips beats and you feel dizzy, light-headed, or very tired.  In some cases, this condition may lead to a stroke. "BE FAST" is an easy way to remember the warning signs of stroke. Get help right away if you have any of the "BE FAST" signs. This information is not intended to replace advice given to you by your health care provider. Make sure you discuss any questions you have with your health care provider. Document Revised: 10/17/2017 Document Reviewed: 10/17/2017 Elsevier Patient Education  2020 [employer]     Long QT Syndrome  Long QT syndrome (LQTS) is a heart condition in which the heart takes longer than normal to recharge after each heartbeat. This is caused by an abnormal electrical system  in the heart. LQTS can upset the timing of your heartbeats. It can cause dangerous changes in your heart rate and rhythm (arrhythmia). There are several types of LQTS. The three most common types are:  Type 1. This can be triggered by stress or exercise, especially swimming.  Type 2. This can be triggered by strong emotions or surprise.  Type 3. This can be triggered when your heart slows during sleep. You can be born with LQTS, or you can develop it later in life. What are the causes? The cause of this condition depends on the type of LQTS that you have.  Inherited LQTS. You are born with this condition. It is caused by an abnormal gene that is passed down through your family.  Acquired LQTS. You get this condition later in life. It may be caused by: ? Certain medicines that affect your heartbeat. Some examples are methadone and antihistamines. ? Long periods of vomiting or diarrhea. ? An eating disorder. ?  A thyroid disorder. What increases the risk? This condition is more likely to develop in:  People who are born deaf.  Women.  People who have an eating disorder, such as anorexia nervosa or bulimia.  People who have a family member with LQTS.  People who have a family history of unexplained fainting, drowning, or sudden death. What are the signs or symptoms? Symptoms of this condition include:  Fainting.  A fluttering feeling in your chest.  Loud gasping during sleep.  Seizures. Symptoms of inherited LQTS almost always start before age 72.  Some people with this condition have no symptoms. How is this diagnosed? This condition may be diagnosed based on:  Your symptoms.  Your medical history and family history.  A physical exam.  Some tests, including: ? An electrocardiogram (ECG) to measure electrical activity in your heart. ? Holter monitoring to record your heartbeat for 1-2 days. ? Stress test to record your heartbeat while you exercise. ? A blood test  to look for genes that cause LQTS. How is this treated? There is no cure for this condition. Treatment depends on the cause, your symptoms, and whether you have a family history of sudden death. Treatment may include:  Making lifestyle changes, such as avoiding competitive sports or stressful situations.  Taking supplements to correct abnormal salt (sodium), potassium, calcium, and magnesium levels.  Stopping the use of a medicine. Do not stop the use of medicines without first talking to your health care provider.  Taking heart medicines, such as beta blockers.  Implanting a device that corrects a dangerous heartbeat, such as: ? A pacemaker. This helps your heart beat in a normal rhythm. ? A cardioverter-defibrillator. This senses a fast heartbeat and shocks the heart to restore normal heart rate.  Having heart surgery to prevent arrhythmias. Follow these instructions at home: Medicine  Take over-the-counter and prescription medicines only as told by your health care provider.  If you want to take any new medicine, get approval from your health care provider first. Avoid any medicines that can cause this condition. Lifestyle  Make any lifestyle changes that are recommended by your health care provider. You may need to avoid: ? Competitive sports. ? Strenuous exercises and activities, such as swimming. ? Stress. ? Situations where sudden loud noises are likely.  If you drink alcohol, limit how much you have: ? 0-2 drinks a day for men. ? 0-1 drink a day for women.  Be aware of how much alcohol is in your drink. In the [place] one drink equals one typical bottle of beer (12 oz), one-half glass of wine (5 oz), or one shot of hard liquor (1 oz).  Do not use any products that contain nicotine or tobacco, such as cigarettes and e-cigarettes. If you need help quitting, ask your health care provider. General instructions  Develop a plan with your health care provider for how to deal  with a sudden arrhythmia.  Tell people who live with you about the signs of a sudden arrhythmia.  Wear a medical ID necklace or bracelet that states your diagnosis and contact information.  Have an automated external defibrillator (AED) available at home or work.  Get treatment and support if you feel stress, fear, anxiety, or depression.  Keep all follow-up visits as told by your health care provider. This is important. Contact a health care provider if:  You are suffering from stress, fear, anxiety, or depression.  You vomit.  You have diarrhea. Get help right away if:  You have chest pain or difficulty breathing.  You have a fluttering feeling in your chest.  You faint.  You have a seizure. These symptoms may represent a serious problem that is an emergency. Do not wait to see if the symptoms will go away. Get medical help right away. Call your local emergency services (911 in the [place] Do not drive yourself to the hospital. Summary  Long QT syndrome (LQTS) is a heart condition in which your heart takes longer than normal to recharge after each heartbeat. This is caused by an abnormal electrical system in your heart.  LQTS can upset the timing of your heartbeats and cause dangerous changes in your heart rate and rhythm (arrhythmia).  Some people are born with LQTS (inherited). Others develop it later in life (acquired).  Some people with this condition have no symptoms. Those who do have symptoms may experience fainting, a fluttering feeling in the chest, loud gasping during sleep, or seizures.  There is no cure for this condition. Treatment depends on the cause, your symptoms, and whether you have a family history of sudden death. This information is not intended to replace advice given to you by your health care provider. Make sure you discuss any questions you have with your health care provider. Document Revised: 02/19/2017 Document Reviewed: 02/19/2017 Elsevier  Patient Education  2020 [employer].

## 2019-10-20 ENCOUNTER — Ambulatory Visit (INDEPENDENT_AMBULATORY_CARE_PROVIDER_SITE_OTHER): Payer: 59 | Admitting: Adult Health Nurse Practitioner

## 2019-10-20 ENCOUNTER — Other Ambulatory Visit: Payer: Self-pay

## 2019-10-20 ENCOUNTER — Encounter: Payer: Self-pay | Admitting: Adult Health Nurse Practitioner

## 2019-10-20 VITALS — BP 118/68 | HR 86 | Temp 98.1°F | Wt 129.6 lb

## 2019-10-20 DIAGNOSIS — R509 Fever, unspecified: Secondary | ICD-10-CM

## 2019-10-20 DIAGNOSIS — R1084 Generalized abdominal pain: Secondary | ICD-10-CM

## 2019-10-20 DIAGNOSIS — R350 Frequency of micturition: Secondary | ICD-10-CM | POA: Diagnosis not present

## 2019-10-20 DIAGNOSIS — R197 Diarrhea, unspecified: Secondary | ICD-10-CM

## 2019-10-20 DIAGNOSIS — R109 Unspecified abdominal pain: Secondary | ICD-10-CM

## 2019-10-20 LAB — POC COVID19 BINAXNOW: SARS Coronavirus 2 Ag: NEGATIVE

## 2019-10-20 MED ORDER — DICYCLOMINE HCL 10 MG PO CAPS: 10.0000 mg | ORAL_CAPSULE | Freq: Four times a day (QID) | ORAL | 0 refills | Status: DC | PRN

## 2019-10-20 NOTE — Addendum Note (Signed)
Addended byElder Negus A on: 10/20/2019 10:17 PM   Modules accepted: Orders

## 2019-10-20 NOTE — Progress Notes (Addendum)
Assessment and Plan:  Jarita was seen today for acute visit, abdominal cramping, diarrhea and back pain.  Diagnoses and all orders for this visit:  Generalized abdominal pain Abdominal cramping Discussed multiple etiologies viral gastritis, diverticulitis of infectious origin. Discussed likelihood of viral with slow improvement of symptoms Discussed hospital precautions / when to call or return -     CBC with Differential/Platelet -     COMPLETE METABOLIC PANEL WITH GFR -Rx dicyclomine 10mg  as needed.  If symptoms return increase take one tablet before meals and at bedtime.  Urinary frequency -     Urinalysis w microscopic + reflex cultur Increase water intake  Diarrhea of presumed infectious origin -     POC COVID-19 BinaxNow - Negative Discussed BRAT diet Monitor symptoms, frequency  Elevated temperature Continue to monitor symptoms   Call or return with any new or worsening symptoms    Further disposition pending results of labs. Discussed med's effects and SE's.   Over 30 minutes of face to face interview, exam, counseling, chart review, and critical decision making was performed.   Future Appointments  Date Time Provider Department Center  09/01/2020 10:00 AM 09/03/2020, NP [facility] None    ------------------------------------------------------------------------------------------------------------------   HPI 39 y.o.female presents for reports that her symptoms started about 6 days ago and she has low back back, headache, cramping and bloating.  She reports a fever was 100.7.  She had diarrhea for two days, Type 7 and spent two days in bathroom about 3 days ago, she did not keep track of instances.   She reports nothing makes the back pain better it is dull in quality but had not resolved.  She has been tolerating soup and liquids.  She fells very cramping and bloated.  She did have some bread and apple on Monday.  Denies any nausea. She does have an IUD and  LMP: 10/06/19.   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  She also has cramps and diarrhea but mild in nature.   She took Excedrin for her headache which helped some.  She took another dose on Monday which helped.  She does drink coffee everyday but did not when she was having the diarrhea.  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  She has felt so bad that she has not been running or participating in any exercise.  She is not taking any new supplements or protein liquids gels during her training.   History reviewed. No pertinent past medical history.   Allergies  Allergen Reactions  . Other Anaphylaxis    Honey bees    Current Outpatient Medications on File Prior to Visit  Medication Sig  . Ascorbic Acid (VITAMIN C) 1000 MG tablet Take 1,000 mg by mouth daily.  . Cholecalciferol (VITAMIN D3) 250 MCG (10000 UT) TABS Take by mouth daily.  . Cyanocobalamin (B-12 PO) Take by mouth daily.  Saturday EPINEPHrine 0.3 mg/0.3 mL IJ SOAJ injection Inject 0.3 mg into the muscle as needed for anaphylaxis.  . Ibuprofen (ADVIL) 200 MG CAPS Take 200 mg by mouth as needed.  . rizatriptan (MAXALT) 10 MG tablet Take 1 tablet (10 mg total) by mouth as needed for migraine. May repeat in 2 hours if needed   No current facility-administered medications on file prior to visit.    ROS: all negative except above.   Physical Exam:  BP 118/68   Pulse 86   Temp 98.1 F (36.7 C)   Wt 129 lb 9.6 oz (58.8 kg)   SpO2 96%   BMI 21.90 kg/m   General Appearance: Well nourished, pale, in no apparent distress. Eyes: PERRLA, EOMs, conjunctiva no swelling or erythema Sinuses: No Frontal/maxillary tenderness ENT/Mouth: Ext aud canals clear, TMs without erythema, bulging. No erythema, swelling, or exudate on post pharynx.  Tonsils not swollen or erythematous. Hearing normal.  Neck: Supple, thyroid normal.   Respiratory: Respiratory effort normal, BS equal bilaterally without rales, rhonchi, wheezing or stridor.  Cardio: RRR with no MRGs. Brisk peripheral pulses without edema.  Abdomen: Soft, + BS, hyperactive.  Non tender, no guarding, rebound, hernias, masses. Lymphatics: Non tender without lymphadenopathy.  Musculoskeletal: Full ROM, 5/5 strength, normal gait.  Skin: Warm, dry without rashes, lesions, ecchymosis.  Neuro: Cranial nerves intact. Normal muscle tone, no cerebellar symptoms. Sensation intact.  Psych: Awake and oriented X 3, normal affect, Insight and Judgment appropriate.     Elder Negus, Edrick Oh, DNP [facility] 10/20/2019  4:44 PM

## 2019-10-20 NOTE — Patient Instructions (Signed)
  Today you had a telephone visit with Elder Negus, DNP.  Below is a summary of your visit.   We are going to check Urine sample and blood work.  We will respond in 1-3 days via MyChart with your results.  We have sent in a prescription for Dicyclomine (Bentyl)  Take one tablet as needed for cramping or bloating.  If you have these symptoms after eating start taking before every meal.  You can take this up to four times a day.  Continue to eat foods that are easy to digest, Bananas, rice apples, applesauce, toast.  You may have coffee, although it is a bowel irritant and may increase diarrhea.  Continue Excedrin for headache and you may take more than one a day as on package for symptoms.   Continue to monitor your symptoms and let us know if you have any NEW or WORSENING symptoms by calling office or via MyChart.

## 2019-10-21 ENCOUNTER — Encounter: Payer: Self-pay | Admitting: Adult Health Nurse Practitioner

## 2019-10-21 ENCOUNTER — Other Ambulatory Visit: Payer: Self-pay | Admitting: Adult Health Nurse Practitioner

## 2019-10-21 DIAGNOSIS — N3000 Acute cystitis without hematuria: Secondary | ICD-10-CM

## 2019-10-21 LAB — CBC WITH DIFFERENTIAL/PLATELET
Absolute Monocytes: 448 cells/uL (ref 200–950)
Basophils Absolute: 39 cells/uL (ref 0–200)
Basophils Relative: 0.7 %
Eosinophils Absolute: 67 cells/uL (ref 15–500)
Eosinophils Relative: 1.2 %
HCT: 44.2 % (ref 35.0–45.0)
Hemoglobin: 14.9 g/dL (ref 11.7–15.5)
Lymphs Abs: 1915 cells/uL (ref 850–3900)
MCH: 32.4 pg (ref 27.0–33.0)
MCHC: 33.7 g/dL (ref 32.0–36.0)
MCV: 96.1 fL (ref 80.0–100.0)
MPV: 11.7 fL (ref 7.5–12.5)
Monocytes Relative: 8 %
Neutro Abs: 3130 cells/uL (ref 1500–7800)
Neutrophils Relative %: 55.9 %
Platelets: 159 10*3/uL (ref 140–400)
RBC: 4.6 10*6/uL (ref 3.80–5.10)
RDW: 13 % (ref 11.0–15.0)
Total Lymphocyte: 34.2 %
WBC: 5.6 10*3/uL (ref 3.8–10.8)

## 2019-10-21 LAB — URINALYSIS W MICROSCOPIC + REFLEX CULTURE
Bilirubin Urine: NEGATIVE
Glucose, UA: NEGATIVE
Hgb urine dipstick: NEGATIVE
Hyaline Cast: NONE SEEN /LPF
Ketones, ur: NEGATIVE
Leukocyte Esterase: NEGATIVE
Nitrites, Initial: NEGATIVE
Protein, ur: NEGATIVE
RBC / HPF: NONE SEEN /HPF (ref 0–2)
Specific Gravity, Urine: 1.003 (ref 1.001–1.03)
WBC, UA: NONE SEEN /HPF (ref 0–5)
pH: 6.5 (ref 5.0–8.0)

## 2019-10-21 LAB — COMPLETE METABOLIC PANEL WITH GFR
AG Ratio: 2 (calc) (ref 1.0–2.5)
ALT: 20 U/L (ref 6–29)
AST: 21 U/L (ref 10–30)
Albumin: 4.7 g/dL (ref 3.6–5.1)
Alkaline phosphatase (APISO): 51 U/L (ref 31–125)
BUN: 8 mg/dL (ref 7–25)
CO2: 28 mmol/L (ref 20–32)
Calcium: 9.8 mg/dL (ref 8.6–10.2)
Chloride: 102 mmol/L (ref 98–110)
Creat: 0.62 mg/dL (ref 0.50–1.10)
GFR, Est African American: 133 mL/min/{1.73_m2} (ref >=60)
GFR, Est Non African American: 114 mL/min/{1.73_m2} (ref >=60)
Globulin: 2.3 g/dL (calc) (ref 1.9–3.7)
Glucose, Bld: 83 mg/dL (ref 65–99)
Potassium: 4.4 mmol/L (ref 3.5–5.3)
Sodium: 140 mmol/L (ref 135–146)
Total Bilirubin: 0.4 mg/dL (ref 0.2–1.2)
Total Protein: 7 g/dL (ref 6.1–8.1)

## 2019-10-21 LAB — NO CULTURE INDICATED

## 2019-10-21 MED ORDER — SULFAMETHOXAZOLE-TRIMETHOPRIM 800-160 MG PO TABS: 1.0000 | ORAL_TABLET | Freq: Two times a day (BID) | ORAL | 0 refills | Status: DC

## 2020-01-25 ENCOUNTER — Encounter: Payer: BC Managed Care – PPO | Admitting: Physician Assistant

## 2020-01-27 ENCOUNTER — Ambulatory Visit (INDEPENDENT_AMBULATORY_CARE_PROVIDER_SITE_OTHER): Payer: 59 | Admitting: Internal Medicine

## 2020-01-27 ENCOUNTER — Other Ambulatory Visit: Payer: Self-pay

## 2020-01-27 VITALS — BP 110/82 | HR 80 | Temp 97.9°F | Resp 16

## 2020-01-27 DIAGNOSIS — Z1152 Encounter for screening for COVID-19: Secondary | ICD-10-CM | POA: Diagnosis not present

## 2020-01-27 DIAGNOSIS — J0410 Acute tracheitis without obstruction: Secondary | ICD-10-CM | POA: Diagnosis not present

## 2020-01-27 LAB — POC COVID19 BINAXNOW: SARS Coronavirus 2 Ag: NEGATIVE

## 2020-01-27 MED ORDER — DEXAMETHASONE 4 MG PO TABS: ORAL_TABLET | ORAL | 0 refills | Status: DC

## 2020-01-27 NOTE — Progress Notes (Signed)
° °  History of Present Illness:     This very nice 39 yo single WF with negative PMHx presents  With a 2-3 day hx/o chest congestion, but dry non productive cough and a burning sensation in her chest . No fever, chills, sweats, rigors, dyspnea or rash.   Medications    EPINEPHrine 0.3 mg/0.3 mL IJ SOAJ injection, Inject 0.3 mg into the muscle as needed for anaphylaxis.    Ibuprofen 200 MG CAPS, Take 200 mg  as needed.    rizatriptan (MAXALT) 10 MG , Take 1 tablet as needed for migraine. May repeat in 2 hours if needed    VITAMIN C) 1000 MG tablet, Take 1,000 mg  daily.   VITAMIN D 10,000 u, Take daily.  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   Observations/Objective:  BP 110/82    Pulse 80    Temp 97.9 F (36.6 C)    Resp 16    SpO2 98%   HEENT - WNL. TM's -Nl. No sinus tenderness. N/O/P - Clear.  Neck - supple. O sig LN's.  Chest - Few scattered dry rales clearing w/cough.  Cor - Nl HS. RRR w/o sig MGR. PP 1(+). No edema. MS- FROM w/o deformities.  Gait Nl. Neuro -  Nl w/o focal abnormalities.  Assessment and Plan:  1. Tracheitis  - dexamethasone  4 MG tablet; Take 1 tab 3 x day - 3 days, then 2 x day - 3 days, then 1 tab daily  Dispense: 20 tablets  2. Encounter for screening for COVID-19  - POC COVID-19   Follow Up Instructions:      I discussed the assessment and treatment plan with the patient.  Patient to call if develops purulent sputum or sinus drainage. The patient was provided an opportunity to ask questions and all were answered. The patient agreed with the plan and demonstrated an understanding of the instructions.       The patient was advised to call back or seek an in-person evaluation if the symptoms worsen or if the condition fails to improve as anticipated.   Marinus Maw, MD

## 2020-02-15 ENCOUNTER — Encounter: Payer: BC Managed Care – PPO | Admitting: Physician Assistant

## 2020-02-18 ENCOUNTER — Encounter: Payer: 59 | Admitting: Physician Assistant

## 2020-07-18 ENCOUNTER — Other Ambulatory Visit: Payer: Self-pay | Admitting: Internal Medicine

## 2020-07-18 ENCOUNTER — Ambulatory Visit: Payer: 59

## 2020-07-18 ENCOUNTER — Other Ambulatory Visit: Payer: Self-pay

## 2020-07-18 VITALS — BP 116/70 | HR 74 | Temp 97.9°F | Resp 16 | Ht 64.0 in | Wt 132.0 lb

## 2020-07-18 DIAGNOSIS — N390 Urinary tract infection, site not specified: Secondary | ICD-10-CM

## 2020-07-18 MED ORDER — NITROFURANTOIN MONOHYD MACRO 100 MG PO CAPS: ORAL_CAPSULE | ORAL | 0 refills | Status: DC

## 2020-07-18 NOTE — Addendum Note (Signed)
Addended by: Dionicio Stall on: 07/18/2020 04:50 PM   Modules accepted: Orders

## 2020-07-19 ENCOUNTER — Ambulatory Visit: Payer: 59

## 2020-07-19 DIAGNOSIS — N390 Urinary tract infection, site not specified: Secondary | ICD-10-CM | POA: Diagnosis not present

## 2020-07-20 LAB — URINALYSIS, ROUTINE W REFLEX MICROSCOPIC
Bilirubin Urine: NEGATIVE
Glucose, UA: NEGATIVE
Hyaline Cast: NONE SEEN /LPF
Ketones, ur: NEGATIVE
Nitrite: POSITIVE — AB
Protein, ur: NEGATIVE
Specific Gravity, Urine: 1.013 (ref 1.001–1.035)
Squamous Epithelial / HPF: NONE SEEN /HPF (ref <=5)
pH: 6.5 (ref 5.0–8.0)

## 2020-07-20 LAB — URINE CULTURE
MICRO NUMBER:: 12001075
SPECIMEN QUALITY:: ADEQUATE

## 2020-09-01 ENCOUNTER — Ambulatory Visit (INDEPENDENT_AMBULATORY_CARE_PROVIDER_SITE_OTHER): Payer: 59 | Admitting: Adult Health

## 2020-09-01 ENCOUNTER — Other Ambulatory Visit: Payer: Self-pay

## 2020-09-01 ENCOUNTER — Encounter: Payer: Self-pay | Admitting: Adult Health

## 2020-09-01 VITALS — BP 114/76 | HR 70 | Temp 97.9°F | Ht 65.0 in | Wt 135.6 lb

## 2020-09-01 DIAGNOSIS — R5382 Chronic fatigue, unspecified: Secondary | ICD-10-CM

## 2020-09-01 DIAGNOSIS — Z13220 Encounter for screening for lipoid disorders: Secondary | ICD-10-CM | POA: Diagnosis not present

## 2020-09-01 DIAGNOSIS — Z0001 Encounter for general adult medical examination with abnormal findings: Secondary | ICD-10-CM

## 2020-09-01 DIAGNOSIS — E559 Vitamin D deficiency, unspecified: Secondary | ICD-10-CM | POA: Diagnosis not present

## 2020-09-01 DIAGNOSIS — Z1389 Encounter for screening for other disorder: Secondary | ICD-10-CM | POA: Diagnosis not present

## 2020-09-01 DIAGNOSIS — Z8261 Family history of arthritis: Secondary | ICD-10-CM

## 2020-09-01 DIAGNOSIS — Z9189 Other specified personal risk factors, not elsewhere classified: Secondary | ICD-10-CM

## 2020-09-01 DIAGNOSIS — Z0000 Encounter for general adult medical examination without abnormal findings: Secondary | ICD-10-CM

## 2020-09-01 DIAGNOSIS — Z130 Encounter for screening for diseases of the blood and blood-forming organs and certain disorders involving the immune mechanism: Secondary | ICD-10-CM | POA: Diagnosis not present

## 2020-09-01 DIAGNOSIS — E538 Deficiency of other specified B group vitamins: Secondary | ICD-10-CM

## 2020-09-01 DIAGNOSIS — Z1329 Encounter for screening for other suspected endocrine disorder: Secondary | ICD-10-CM

## 2020-09-01 DIAGNOSIS — G43831 Menstrual migraine, intractable, with status migrainosus: Secondary | ICD-10-CM

## 2020-09-01 DIAGNOSIS — Z91030 Bee allergy status: Secondary | ICD-10-CM

## 2020-09-01 DIAGNOSIS — Z8349 Family history of other endocrine, nutritional and metabolic diseases: Secondary | ICD-10-CM

## 2020-09-01 DIAGNOSIS — F32A Depression, unspecified: Secondary | ICD-10-CM

## 2020-09-01 MED ORDER — BUPROPION HCL ER (XL) 150 MG PO TB24: 150.0000 mg | ORAL_TABLET | ORAL | 2 refills | Status: DC

## 2020-09-01 NOTE — Patient Instructions (Signed)
Bupropion Tablets (Depression/Mood Disorders) What is this medication? BUPROPION (byoo PROE pee on) treats depression. It increases norepinephrine and dopamine in the brain, hormones that help regulate mood. It belongs to a groupof medications called NDRIs. This medicine may be used for other purposes; ask your health care provider orpharmacist if you have questions. COMMON BRAND NAME(S): Wellbutrin What should I tell my care team before I take this medication? They need to know if you have any of these conditions: An eating disorder, such as anorexia or bulimia Bipolar disorder or psychosis Diabetes or high blood sugar, treated with medication Glaucoma Heart disease, previous heart attack, or irregular heart beat Head injury or brain tumor High blood pressure Kidney or liver disease Seizures Suicidal thoughts or a previous suicide attempt Tourette's syndrome Weight loss An unusual or allergic reaction to bupropion, other medications, foods, dyes, or preservatives Pregnant or trying to become pregnant Breast-feeding How should I use this medication? Take this medication by mouth with a glass of water. Follow the directions on the prescription label. You can take it with or without food. If it upsets your stomach, take it with food. Take your medication at regular intervals. Do not take your medication more often than directed. Do not stop taking this medication suddenly except upon the advice of your care team. Stopping this medication too quickly may cause serious side effects or your condition mayworsen. A special MedGuide will be given to you by the pharmacist with eachprescription and refill. Be sure to read this information carefully each time. Talk to your care team regarding the use of this medication in children.Special care may be needed. Overdosage: If you think you have taken too much of this medicine contact apoison control center or emergency room at once. NOTE: This medicine  is only for you. Do not share this medicine with others. What if I miss a dose? If you miss a dose, take it as soon as you can. If it is less than four hours to your next dose, take only that dose and skip the missed dose. Do not takedouble or extra doses. What may interact with this medication? Do not take this medication with any of the following: Linezolid MAOIs like Azilect, Carbex, Eldepryl, Marplan, Nardil, and Parnate Methylene blue (injected into a vein) Other medications that contain bupropion like Zyban This medication may also interact with the following: Alcohol Certain medications for anxiety or sleep Certain medications for blood pressure like metoprolol, propranolol Certain medications for depression or psychotic disturbances Certain medications for HIV or AIDS like efavirenz, lopinavir, nelfinavir, ritonavir Certain medications for irregular heart beat like propafenone, flecainide Certain medications for Parkinson's disease like amantadine, levodopa Certain medications for seizures like carbamazepine, phenytoin, phenobarbital Cimetidine Clopidogrel Cyclophosphamide Digoxin Furazolidone Isoniazid Nicotine Orphenadrine Procarbazine Steroid medications like prednisone or cortisone Stimulant medications for attention disorders, weight loss, or to stay awake Tamoxifen Theophylline Thiotepa Ticlopidine Tramadol Warfarin This list may not describe all possible interactions. Give your health care provider a list of all the medicines, herbs, non-prescription drugs, or dietary supplements you use. Also tell them if you smoke, drink alcohol, or use illegaldrugs. Some items may interact with your medicine. What should I watch for while using this medication? Tell your care team if your symptoms do not get better or if they get worse. Visit your care team for regular checks on your progress. Because it may take several weeks to see the full effects of this medication, it is  important tocontinue your treatment as prescribed.   Watch for new or worsening thoughts of suicide or depression. This includes sudden changes in mood, behavior, or thoughts. These changes can happen at any time but are more common in the beginning of treatment or after a change in dose. Call your care team right away if you experience these thoughts orworsening depression. Manic episodes may happen in patients with bipolar disorder who take this medication. Watch for changes in feelings or behaviors such as feeling anxious, nervous, agitated, panicky, irritable, hostile, aggressive, impulsive, severely restless, overly excited and hyperactive, or trouble sleeping. These symptoms can happen at anytime but are more common in the beginning of treatment or after a change in dose. Call your care team right away if you notice any ofthese symptoms. This medication may cause serious skin reactions. They can happen weeks to months after starting the medication. Contact your care team right away if you notice fevers or flu-like symptoms with a rash. The rash may be red or purple and then turn into blisters or peeling of the skin. Or, you might notice a red rash with swelling of the face, lips or lymph nodes in your neck or under yourarms. Avoid drinks that contain alcohol while taking this medication. Drinking large amounts of alcohol, using sleeping or anxiety medications, or quickly stopping the use of these agents while taking this medication may increase your risk fora seizure. Do not drive or use heavy machinery until you know how this medication affectsyou. This medication can impair your ability to perform these tasks. Do not take this medication close to bedtime. It may prevent you from sleeping. Your mouth may get dry. Chewing sugarless gum or sucking hard candy, and drinking plenty of water may help. Contact your care team if the problem doesnot go away or is severe. What side effects may I notice from  receiving this medication? Side effects that you should report to your care team as soon as possible: Allergic reactions-skin rash, itching, hives, swelling of the face, lips, tongue, or throat Increase in blood pressure Mood and behavior changes-anxiety, nervousness, confusion, hallucinations, irritability, hostility, thoughts of suicide or self-harm, worsening mood, feelings of depression Redness, blistering, peeling, or loosening of the skin, including inside the mouth Seizures Sudden eye pain or change in vision such as blurry vision, seeing halos around lights, vision loss Side effects that usually do not require medical attention (report to your careteam if they continue or are bothersome): Constipation Dizziness Dry mouth Loss of appetite Nausea Tremors or shaking Trouble sleeping This list may not describe all possible side effects. Call your doctor for medical advice about side effects. You may report side effects to FDA at1-800-FDA-1088. Where should I keep my medication? Keep out of the reach of children and pets. Store at room temperature between 20 and 25 degrees C (68 and 77 degrees F), away from direct sunlight and moisture. Keep tightly closed. Throw away anyunused medication after the expiration date. NOTE: This sheet is a summary. It may not cover all possible information. If you have questions about this medicine, talk to your doctor, pharmacist, orhealth care provider.  2022 Elsevier/Gold Standard (2020-04-05 14:26:11)  

## 2020-09-01 NOTE — Progress Notes (Signed)
Complete Physical  Assessment and Plan:  Encounter for Annual Physical Exam with abnormal findings Due annually  Health Maintenance reviewed Healthy lifestyle reviewed and goals set  Intractable menstrual migraine with status migrainosus Improved off BCP, manages with excedrine PRN, has nurtec samples that work very well if needed  Screening for blood or protein in urine -     Urinalysis, Routine w reflex microscopic  Medication management -     CBC with Diff -     COMPLETE METABOLIC PANEL WITH GFR -     Magnesium  Chronic fatigue Possibly multifactoral, check labs and start meds per below, close follow up in 4-6 weeks  Depression - start wellbutrin, counseling - she will reach out via insurance, keep log/journal - Stress management techniques discussed, increase water, good sleep hygiene discussed, increase exercise, and increase veggies.  - Excess intake of alcohol - suggested to stop for now, clean diet/lifestyle, in agreement  Family hx of RA/arthralgias/fatigue - ANA, CRP, ESR - discussed may need further workup if persistent polyarthralgias, fatigue not improving with other interventions  Family history of thyroid disease -     TSH  Screening, anemia, deficiency -     Iron, Total/Total Iron Binding Cap -     Ferritin -     Vitamin B12  Vitamin D deficiency -     Vitamin D (25 hydroxy)  Discussed med's effects and SE's. Screening labs and tests as requested with regular follow-up as recommended.  Future Appointments  Date Time Provider [place]  10/05/2020 11:30 AM Liane Comber, NP [facility] None  09/01/2021 10:00 AM Liane Comber, NP [facility] None     HPI  40 y.o. female  presents for a complete physical. She has Headache, menstrual migraine; At risk for prolonged QT interval syndrome; Vitamin D deficiency; Chronic fatigue; Depression; and Bee sting allergy on their problem list. 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 She is back on the nuvaring recently via GYN Dr. Edwyna Ready. Has some mentrual migraines, does well with early Excedrin. Plans to ask about diaphragm next visit, wants to try a new/female provider.   She reports ongoing fatigue since high school, acutely worse in the last year. She reports wakes up fatigued, "feels like walking through mud", feels like mental weight. Notably father was dx with chronic fatigue syndrome, MGGM had hx of RA. She does note intermittent joint pains in hands and feet, stiffness though not consistent.   BMI is Body mass index is 22.57 kg/m., BMI is Body mass index is 22.57 kg/m., she is working on diet and exercise. Admits eating too much processed foods, but typically eats well balanced diet heavy in veggies. 2 cups of coffee early in the day. Estimates 32-64 ounces daily. Does admit to 7-9 drinks of alcohol per week. Sleeps consistently but doesn't always wake up feeling rested. Denies awakenings or AM headache, snoring. Does take naps in the afternoon due to fatigue.  Wt Readings from Last 3 Encounters:  09/01/20 135 lb 9.6 oz (61.5 kg)  07/18/20 132 lb (59.9 kg)  10/20/19 129 lb 9.6 oz (58.8 kg)   Regular cycles, not currently on iron Lab Results  Component Value Date   IRON 113 02/18/2019   TIBC 348 02/18/2019   FERRITIN 11 (L)  02/18/2019   She was on E09 and folic acid combo but stopped in the past year:  Lab Results  Component Value Date   VITAMINB12 1,154 (H) 02/18/2019   She is not on biotin, mom being treated for hypothyroidism. Lab Results  Component Value Date   TSH 1.11 02/18/2019   Her blood pressure has been controlled at home, today their BP is BP: 114/76 She does workout, she has long hx of running 50-100 miles a month, but less this past  year with fatigue and business.  She denies chest pain, shortness of breath, dizziness.   She is not on cholesterol medication and denies myalgias. Her cholesterol is at goal. The cholesterol last visit was:   Lab Results  Component Value Date   CHOL 180 02/18/2019   HDL 72 02/18/2019   LDLCALC 92 02/18/2019   TRIG 75 02/18/2019   CHOLHDL 2.5 02/18/2019   Last A1C in the office was:  Lab Results  Component Value Date   HGBA1C 5.5 12/07/2013   Patient is on Vitamin D supplement, typically takes 10000 IU daily but stopped in the last week.  Lab Results  Component Value Date   VD25OH 34 02/18/2019      Current Medications:  Current Outpatient Medications on File Prior to Visit  Medication Sig   Ascorbic Acid (VITAMIN C) 1000 MG tablet Take 1,000 mg by mouth daily.   Cholecalciferol (VITAMIN D3) 250 MCG (10000 UT) TABS Take by mouth daily.   EPINEPHrine 0.3 mg/0.3 mL IJ SOAJ injection Inject 0.3 mg into the muscle as needed for anaphylaxis.   Ibuprofen 200 MG CAPS Take 200 mg by mouth as needed.   Cyanocobalamin (B-12 PO) Take by mouth daily. (Patient not taking: Reported on 09/01/2020)   No current facility-administered medications on file prior to visit.   Health Maintenance:   Immunization History  Administered Date(s) Administered   HPV Quadrivalent 07/19/2005, 10/09/2005, 01/17/2006   Moderna Sars-Covid-2 Vaccination 06/11/2019, 07/09/2019   Tdap 09/17/2007, 01/15/2018   Tetanus: 2019 Pneumovax: Flu vaccine: declines Gardasil X 3 last one 2007 Covid 19: 2/2 - send covid vaccine infor  LMP: Yesterday Pap: normal PAP 08/30/2016 neg HPV MGM:  Colonoscopy: EGD: CT head 2011  Patient Care Team: Unk Pinto, MD as PCP - General (Internal Medicine)  Medical History:  Past Medical History:  Diagnosis Date   Chronic fatigue    Migraines      Allergies Allergies  Allergen Reactions   Other Anaphylaxis    Honey bees    SURGICAL HISTORY She  has a  past surgical history that includes Closed reduction clavicle fracture (Right, 2011).   FAMILY HISTORY Her family history includes Alzheimer's disease in her paternal great-grandfather; Cancer in her father and paternal grandfather; Chronic fatigue in her father; Dementia in her maternal grandfather; Heart attack in her paternal grandfather; Heart disease (age of onset: 49) in her father; Hypertension in her paternal grandfather; Stroke in her paternal grandfather.   SOCIAL HISTORY She  reports that she has never smoked. She has never used smokeless tobacco. She reports current alcohol use of about 7.0 standard drinks of alcohol per week. She reports that she does not use drugs.   Review of Systems  Constitutional:  Positive for malaise/fatigue. Negative for chills, diaphoresis, fever and weight loss.  HENT:  Negative for congestion, ear discharge, ear pain, hearing loss, nosebleeds, sore throat and tinnitus.   Eyes:  Negative for blurred vision and double vision.  Respiratory: Negative.  Negative  for cough, sputum production, shortness of breath, wheezing and stridor.   Cardiovascular: Negative.  Negative for chest pain, palpitations, orthopnea, claudication, leg swelling and PND.  Gastrointestinal: Negative.  Negative for abdominal pain, blood in stool, constipation, diarrhea, heartburn, melena, nausea and vomiting.  Genitourinary: Negative.   Musculoskeletal: Negative.  Negative for falls, joint pain and myalgias.  Skin: Negative.  Negative for rash.  Neurological:  Negative for dizziness, tingling, tremors, sensory change, speech change, focal weakness, seizures, loss of consciousness, weakness and headaches.  Endo/Heme/Allergies:  Negative for polydipsia.  Psychiatric/Behavioral:  Positive for depression. Negative for hallucinations, memory loss, substance abuse and suicidal ideas. The patient is not nervous/anxious and does not have insomnia.   All other systems reviewed and are  negative.   Physical Exam: Estimated body mass index is 22.57 kg/m as calculated from the following:   Height as of this encounter: _0  (1.651 m).   Weight as of this encounter: 135 lb 9.6 oz (61.5 kg). BP 114/76   Pulse 70   Temp 97.9 F (36.6 C)   Ht _1  (1.651 m)   Wt 135 lb 9.6 oz (61.5 kg)   SpO2 99%   BMI 22.57 kg/m  General Appearance: Well nourished, in no apparent distress.  Eyes: PERRLA, EOMs, conjunctiva no swelling or erythema, normal fundi and vessels.  Sinuses: No Frontal/maxillary tenderness  ENT/Mouth: Ext aud canals clear, normal light reflex with TMs without erythema, bulging. Good dentition. No erythema, swelling, or exudate on post pharynx. Tonsils not swollen or erythematous. Hearing normal.  Neck: Supple, thyroid normal. No bruits  Respiratory: Respiratory effort normal, BS equal bilaterally without rales, rhonchi, wheezing or stridor.  Cardio: RRR without murmurs, rubs or gallops. Brisk peripheral pulses without edema.  Chest: symmetric, with normal excursions and percussion.  Breasts: Defer to GYN, no concerns Abdomen: Soft, nontender, no guarding, rebound, hernias, masses, or organomegaly.   Lymphatics: Non tender without lymphadenopathy.  Genitourinary: defer to GYN Musculoskeletal: Full ROM all peripheral extremities,5/5 strength, and normal gait.  Skin: Warm, dry without rashes, lesions, ecchymosis. Neuro: Cranial nerves intact, reflexes equal bilaterally. Normal muscle tone, no cerebellar symptoms. Sensation intact.  Psych: Awake and oriented X 3, normal/briefly tearful affect, Insight and Judgment appropriate.   Depression screen PHQ 2/9 09/01/2020  Decreased Interest 3  Down, Depressed, Hopeless 2  PHQ - 2 Score 5  Altered sleeping 3  Tired, decreased energy 3  Change in appetite 3  Feeling bad or failure about yourself  1  Trouble concentrating 1  Moving slowly or fidgety/restless 0  Suicidal thoughts 0  PHQ-9 Score 16  Difficult doing  work/chores Very difficult     EKG: 08/2019 sinus bradycardia with arrythmia, RSR' reviewed - no new concerns, fatigue is ongoing, defer today   Ashlee Ribas, NP 12:41 PM [facility]

## 2020-09-04 LAB — CBC WITH DIFFERENTIAL/PLATELET
Absolute Monocytes: 352 cells/uL (ref 200–950)
Basophils Absolute: 21 cells/uL (ref 0–200)
Basophils Relative: 0.3 %
Eosinophils Absolute: 28 cells/uL (ref 15–500)
Eosinophils Relative: 0.4 %
HCT: 45 % (ref 35.0–45.0)
Hemoglobin: 15.4 g/dL (ref 11.7–15.5)
Lymphs Abs: 1960 cells/uL (ref 850–3900)
MCH: 33 pg (ref 27.0–33.0)
MCHC: 34.2 g/dL (ref 32.0–36.0)
MCV: 96.6 fL (ref 80.0–100.0)
MPV: 12.3 fL (ref 7.5–12.5)
Monocytes Relative: 5.1 %
Neutro Abs: 4540 cells/uL (ref 1500–7800)
Neutrophils Relative %: 65.8 %
Platelets: 184 10*3/uL (ref 140–400)
RBC: 4.66 10*6/uL (ref 3.80–5.10)
RDW: 12.6 % (ref 11.0–15.0)
Total Lymphocyte: 28.4 %
WBC: 6.9 10*3/uL (ref 3.8–10.8)

## 2020-09-04 LAB — COMPLETE METABOLIC PANEL WITH GFR
AG Ratio: 2 (calc) (ref 1.0–2.5)
ALT: 9 U/L (ref 6–29)
AST: 11 U/L (ref 10–30)
Albumin: 4.7 g/dL (ref 3.6–5.1)
Alkaline phosphatase (APISO): 42 U/L (ref 31–125)
BUN: 12 mg/dL (ref 7–25)
CO2: 27 mmol/L (ref 20–32)
Calcium: 9.7 mg/dL (ref 8.6–10.2)
Chloride: 103 mmol/L (ref 98–110)
Creat: 0.75 mg/dL (ref 0.50–0.97)
Globulin: 2.4 g/dL (calc) (ref 1.9–3.7)
Glucose, Bld: 87 mg/dL (ref 65–99)
Potassium: 4.4 mmol/L (ref 3.5–5.3)
Sodium: 140 mmol/L (ref 135–146)
Total Bilirubin: 0.4 mg/dL (ref 0.2–1.2)
Total Protein: 7.1 g/dL (ref 6.1–8.1)
eGFR: 104 mL/min/{1.73_m2} (ref >=60)

## 2020-09-04 LAB — VITAMIN B12: Vitamin B-12: 347 pg/mL (ref 200–1100)

## 2020-09-04 LAB — ANTI-NUCLEAR AB-TITER (ANA TITER): ANA Titer 1: 1:40 {titer} — ABNORMAL HIGH

## 2020-09-04 LAB — URINALYSIS, ROUTINE W REFLEX MICROSCOPIC
Bilirubin Urine: NEGATIVE
Glucose, UA: NEGATIVE
Hgb urine dipstick: NEGATIVE
Ketones, ur: NEGATIVE
Leukocytes,Ua: NEGATIVE
Nitrite: NEGATIVE
Protein, ur: NEGATIVE
Specific Gravity, Urine: 1.013 (ref 1.001–1.035)
pH: 5.5 (ref 5.0–8.0)

## 2020-09-04 LAB — C-REACTIVE PROTEIN: CRP: 3.2 mg/L (ref <8.0)

## 2020-09-04 LAB — TEST AUTHORIZATION

## 2020-09-04 LAB — IRON,TIBC AND FERRITIN PANEL
%SAT: 45 % (calc) (ref 16–45)
Ferritin: 35 ng/mL (ref 16–154)
Iron: 159 ug/dL (ref 40–190)
TIBC: 355 mcg/dL (calc) (ref 250–450)

## 2020-09-04 LAB — ANA: Anti Nuclear Antibody (ANA): POSITIVE — AB

## 2020-09-04 LAB — TSH: TSH: 0.95 mIU/L

## 2020-09-04 LAB — SEDIMENTATION RATE: Sed Rate: 2 mm/h (ref 0–20)

## 2020-09-04 LAB — VITAMIN D 25 HYDROXY (VIT D DEFICIENCY, FRACTURES): Vit D, 25-Hydroxy: 68 ng/mL (ref 30–100)

## 2020-09-05 ENCOUNTER — Other Ambulatory Visit: Payer: Self-pay | Admitting: Adult Health

## 2020-09-05 ENCOUNTER — Encounter: Payer: Self-pay | Admitting: Adult Health

## 2020-09-05 DIAGNOSIS — R768 Other specified abnormal immunological findings in serum: Secondary | ICD-10-CM

## 2020-09-23 ENCOUNTER — Other Ambulatory Visit: Payer: Self-pay | Admitting: Adult Health

## 2020-10-05 ENCOUNTER — Other Ambulatory Visit: Payer: Self-pay

## 2020-10-05 ENCOUNTER — Ambulatory Visit: Payer: 59 | Admitting: Adult Health

## 2020-10-05 ENCOUNTER — Encounter: Payer: Self-pay | Admitting: Adult Health

## 2020-10-05 VITALS — BP 102/68 | HR 83 | Temp 97.7°F | Wt 132.8 lb

## 2020-10-05 DIAGNOSIS — F32A Depression, unspecified: Secondary | ICD-10-CM

## 2020-10-05 DIAGNOSIS — R768 Other specified abnormal immunological findings in serum: Secondary | ICD-10-CM

## 2020-10-05 DIAGNOSIS — R5382 Chronic fatigue, unspecified: Secondary | ICD-10-CM | POA: Diagnosis not present

## 2020-10-05 NOTE — Progress Notes (Signed)
Assessment and Plan:  Karem was seen today for follow-up.  Diagnoses and all orders for this visit:  ANA positive Fairly low titer, does have family hx of autoimmune Will check lupus specific tests but overall sx improved with depression treatment, if negative will plan to monitor at this time -     Anti-DNA antibody, double-stranded -     Anti-Smith antibody  Chronic fatigue Significantly improved with lifestyle changes and Wellbutrin  Depression Dramatic improvement with wellbutrin 150 mg daily; PHQ improved 16 >2. Continue medications  Lifestyle discussed: diet/exerise, sleep hygiene, stress management, hydration  Further disposition pending results of labs. Discussed med's effects and SE's.   Over 20 minutes of exam, counseling, chart review, and critical decision making was performed.   Future Appointments  Date Time Provider [place]  09/01/2021 10:00 AM Liane Comber, NP [facility] None    ------------------------------------------------------------------------------------------------------------------   HPI BP 102/68   Pulse 83   Temp 97.7 F (36.5 C)   Wt 132 lb 12.8 oz (60.2 kg)   SpO2 99%   BMI 22.10 kg/m  40 y.o.female presents for 4 week follow up on fatigue, depression, and positive ANA test.   Hast OV at her CPE reported chronic fatigue since high school, worse in the last year; noted family hx of RA, chronic fatigue syndrome. CRP, ESR were normal but ANA was positive, 1:40 with homogeneous nuclear pattern. CMP/GFR and UA were normal. Denies rash, arthralgias. She has reported some joint stiffness.   PHQ-9 was signifcantly positive at 16 last OV; discussed and initiated wellbutrin 150 mg, reports noted immediate improvement in energy levels and mood, no longer taking daily nap, sleep quality is good. Denies notable SE. PHQ of 2 today.   BMI is Body mass index is 22.1 kg/m., she has been working on diet and exercise. She reports reduced fast  food, eating clean since last OV, balanced diet heavy in veggies. Reports cut out previous alcohol completely.   Wt Readings from Last 3 Encounters:  10/05/20 132 lb 12.8 oz (60.2 kg)  09/01/20 135 lb 9.6 oz (61.5 kg)  07/18/20 132 lb (59.9 kg)    Past Medical History:  Diagnosis Date   Chronic fatigue    Migraines      Allergies  Allergen Reactions   Other Anaphylaxis    Honey bees    Current Outpatient Medications on File Prior to Visit  Medication Sig   Ascorbic Acid (VITAMIN C) 1000 MG tablet Take 1,000 mg by mouth daily.   buPROPion (WELLBUTRIN XL) 150 MG 24 hr tablet TAKE 1 TABLET BY MOUTH EVERY DAY IN THE MORNING   Cholecalciferol (VITAMIN D3) 250 MCG (10000 UT) TABS Take by mouth daily.   Cyanocobalamin (B-12 PO) Take by mouth daily.   Ibuprofen 200 MG CAPS Take 200 mg by mouth as needed.   EPINEPHrine 0.3 mg/0.3 mL IJ SOAJ injection Inject 0.3 mg into the muscle as needed for anaphylaxis. (Patient not taking: Reported on 10/05/2020)   No current facility-administered medications on file prior to visit.    ROS: all negative except above.   Physical Exam:  BP 102/68   Pulse 83   Temp 97.7 F (36.5 C)   Wt 132 lb 12.8 oz (60.2 kg)   SpO2 99%   BMI 22.10 kg/m   General Appearance: Well nourished, in no apparent distress. Eyes: conjunctiva no swelling or erythema ENT/Mouth: Mask in place; Hearing normal.  Neck: Supple Respiratory: Respiratory effort normal Cardio: Appears well perfused.  Musculoskeletal:  no apparent deformity, effusion, normal gait.  Skin: Warm, dry without rashes, lesions, ecchymosis.  Neuro: Normal muscle tone Psych: Awake and oriented X 3, normal affect, Insight and Judgment appropriate.     Izora Ribas, NP 11:39 AM [facility]

## 2020-10-06 LAB — ANTI-SMITH ANTIBODY: ENA SM Ab Ser-aCnc: <1 AI

## 2020-10-06 LAB — ANTI-DNA ANTIBODY, DOUBLE-STRANDED: ds DNA Ab: 1 IU/mL

## 2021-03-28 ENCOUNTER — Other Ambulatory Visit: Payer: Self-pay | Admitting: Nurse Practitioner

## 2021-04-25 DIAGNOSIS — R69 Illness, unspecified: Secondary | ICD-10-CM | POA: Diagnosis not present

## 2021-04-25 DIAGNOSIS — Z01411 Encounter for gynecological examination (general) (routine) with abnormal findings: Secondary | ICD-10-CM | POA: Diagnosis not present

## 2021-04-25 DIAGNOSIS — Z124 Encounter for screening for malignant neoplasm of cervix: Secondary | ICD-10-CM | POA: Diagnosis not present

## 2021-04-25 DIAGNOSIS — Z6822 Body mass index (BMI) 22.0-22.9, adult: Secondary | ICD-10-CM | POA: Diagnosis not present

## 2021-04-25 DIAGNOSIS — Z01419 Encounter for gynecological examination (general) (routine) without abnormal findings: Secondary | ICD-10-CM | POA: Diagnosis not present

## 2021-04-25 DIAGNOSIS — Z1231 Encounter for screening mammogram for malignant neoplasm of breast: Secondary | ICD-10-CM | POA: Diagnosis not present

## 2021-05-10 DIAGNOSIS — N6313 Unspecified lump in the right breast, lower outer quadrant: Secondary | ICD-10-CM | POA: Diagnosis not present

## 2021-05-30 DIAGNOSIS — R69 Illness, unspecified: Secondary | ICD-10-CM | POA: Diagnosis not present

## 2021-05-30 DIAGNOSIS — R87810 Cervical high risk human papillomavirus (HPV) DNA test positive: Secondary | ICD-10-CM | POA: Diagnosis not present

## 2021-05-30 DIAGNOSIS — R87615 Unsatisfactory cytologic smear of cervix: Secondary | ICD-10-CM | POA: Diagnosis not present

## 2021-09-01 ENCOUNTER — Encounter: Payer: 59 | Admitting: Nurse Practitioner

## 2021-10-03 ENCOUNTER — Encounter: Payer: Self-pay | Admitting: Nurse Practitioner

## 2021-10-03 ENCOUNTER — Ambulatory Visit: Payer: 59 | Admitting: Nurse Practitioner

## 2021-10-03 VITALS — BP 100/60 | HR 83 | Temp 97.8°F | Resp 16 | Ht 65.0 in | Wt 132.0 lb

## 2021-10-03 DIAGNOSIS — E559 Vitamin D deficiency, unspecified: Secondary | ICD-10-CM | POA: Diagnosis not present

## 2021-10-03 DIAGNOSIS — Z0000 Encounter for general adult medical examination without abnormal findings: Secondary | ICD-10-CM

## 2021-10-03 DIAGNOSIS — R768 Other specified abnormal immunological findings in serum: Secondary | ICD-10-CM

## 2021-10-03 DIAGNOSIS — Z1389 Encounter for screening for other disorder: Secondary | ICD-10-CM | POA: Diagnosis not present

## 2021-10-03 DIAGNOSIS — G43831 Menstrual migraine, intractable, with status migrainosus: Secondary | ICD-10-CM

## 2021-10-03 DIAGNOSIS — Z8261 Family history of arthritis: Secondary | ICD-10-CM | POA: Diagnosis not present

## 2021-10-03 DIAGNOSIS — Z1329 Encounter for screening for other suspected endocrine disorder: Secondary | ICD-10-CM

## 2021-10-03 DIAGNOSIS — Z0001 Encounter for general adult medical examination with abnormal findings: Secondary | ICD-10-CM

## 2021-10-03 DIAGNOSIS — R69 Illness, unspecified: Secondary | ICD-10-CM | POA: Diagnosis not present

## 2021-10-03 DIAGNOSIS — Z79899 Other long term (current) drug therapy: Secondary | ICD-10-CM

## 2021-10-03 DIAGNOSIS — R5382 Chronic fatigue, unspecified: Secondary | ICD-10-CM | POA: Diagnosis not present

## 2021-10-03 DIAGNOSIS — F32A Depression, unspecified: Secondary | ICD-10-CM

## 2021-10-03 NOTE — Patient Instructions (Signed)
Antinuclear Antibody Test Why am I having this test? This is a test that is used to help diagnose systemic lupus erythematosus (SLE) and other autoimmune diseases. An autoimmune disease is a disease in which the body's own defense system (immune system) attacks its organs. What is being tested? This test checks for antinuclear antibodies (ANA) in the blood. The presence of ANA is associated with several autoimmune diseases. It is seen in almost all people with lupus. What kind of sample is taken?  A blood sample is required for this test. It is usually collected by inserting a needle into a blood vessel. How are the results reported? Your test results will be reported as either positive or negative. What do the results mean? A positive test result may mean that you have: Lupus. Other autoimmune diseases, such as rheumatoid arthritis, scleroderma, or Sjgren syndrome. Talk with your health care provider about what your results mean. In some cases, your health care provider may do more testing to confirm the results. More testing may be done because other conditions can sometimes cause a positive result, such as: Liver dysfunction. Myasthenia gravis. Infectious mononucleosis. Questions to ask your health care provider Ask your health care provider, or the department that is doing the test: When will my results be ready? How will I get my results? What are my treatment options? What other tests do I need? What are my next steps? Summary This is a test that is used to help diagnose systemic lupus erythematosus (SLE) and other autoimmune diseases. An autoimmune disease is a disease in which the body's own defense system (immune system) attacks the body. This test checks for antinuclear antibodies (ANA) in the blood. The presence of ANA is associated with several autoimmune diseases. It is seen in almost all people with lupus. Your test results will be reported as either positive or negative.  Talk with your health care provider about what your results mean. This information is not intended to replace advice given to you by your health care provider. Make sure you discuss any questions you have with your health care provider. Document Revised: 09/25/2020 Document Reviewed: 09/25/2020 Elsevier Patient Education  2023 [employer]  

## 2021-10-03 NOTE — Progress Notes (Signed)
Complete Physical  Assessment and Plan:  Encounter for Annual Physical Exam with abnormal findings Due annually  Health Maintenance reviewed Healthy lifestyle reviewed and goals set  Intractable menstrual migraine with status migrainosus Improved off BCP, manages with excedrine PRN, has nurtec samples that work very well if needed Stay well hydrated Discussed magnesium supplement.  Screening for blood or protein in urine Urinalysis, Routine w reflex microscopic  Medication management All medications discussed and reviewed in full. All questions and concerns regarding medications addressed.    -     CBC with Diff -     COMPLETE METABOLIC PANEL WITH GFR -     Magnesium  Depression  Increase Wellbutrin to 150 mg BID Stress management techniques discussed, increase water, good sleep hygiene discussed, increase exercise, and increase veggies.   Family hx of RA/arthralgias/fatigue No improvement, worsening. ANA Positive  Fairly low titer, does have family hx of autoimmune Will check lupus specific tests, RA, EBV  -     Anti-DNA antibody, double-stranded -     Anti-Smith antibody  Family history of thyroid disease -     TSH  Vitamin D deficiency -     Vitamin D (25 hydroxy)  Orders Placed This Encounter  Procedures   CBC with Differential/Platelet   COMPLETE METABOLIC PANEL WITH GFR   Magnesium   Lipid panel   TSH   Hemoglobin A1c   VITAMIN D 25 Hydroxy (Vit-D Deficiency, Fractures)   Urinalysis, Routine w reflex microscopic   Anti-Smith antibody   Cyclic citrul peptide antibody, IgG   Epstein-Barr Virus VCA Antibody Panel- [facility]     Discussed med's effects and SE's. Screening labs and tests as requested with regular follow-up as recommended.  Future Appointments  Date Time Provider Department Center  10/04/2022  3:00 PM Adela Glimpse, NP [facility] None     HPI  41 y.o. female  presents for a complete physical. She has Headache,  menstrual migraine; At risk for prolonged QT interval syndrome; Vitamin D deficiency; Chronic fatigue; Depression; Bee sting allergy; and ANA positive on their problem list. 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  Has decreased her running due to increase in entrepreneurship and staying busy.  Has noticed increase in stress.  She is trying to  manage with daily yoga.    She is back on the nuvaring recently via GYN Dr. Rosemary Holms. Has some menstrual migraines, does well with early Excedrin.   She reports ongoing fatigue that has increased over the year, has had since high school. She reports wakes up fatigued.  Had a positive ANA last year, fairly low, no other work up.   Notably father was dx with chronic fatigue syndrome, MGGM had hx of RA. She does note intermittent joint pains in hands and feet, stiffness though not consistent. Practices yoga for 1 hour daily.  BMI is Body mass index is 21.97 kg/m., BMI is Body mass index is 22.57 kg/m., she is working on diet and exercise. Sleeps consistently but doesn't always wake up feeling rested. Denies awakenings or AM headache, snoring. Does take naps in the afternoon due to fatigue.  Wt Readings from Last 3 Encounters:  10/03/21 132 lb (59.9 kg)  10/05/20 132 lb 12.8 oz (60.2 kg)  09/01/20 135 lb 9.6 oz (61.5 kg)   Regular cycles, not  currently on iron Lab Results  Component Value Date   IRON 159 09/01/2020   TIBC 355 09/01/2020   FERRITIN 35 09/01/2020   She was on B12 and folic acid combo but stopped in the past year:  Lab Results  Component Value Date   VITAMINB12 347 09/01/2020   She is not on biotin, mom being treated for hypothyroidism. Lab Results  Component Value Date   TSH 0.95 09/01/2020   Her blood pressure has been controlled at home, today their BP  is BP: 100/60 She does workout, she has long hx of running 50-100 miles a month, but less this past year with fatigue and business.  She denies chest pain, shortness of breath, dizziness.   She is not on cholesterol medication and denies myalgias. Her cholesterol is at goal. The cholesterol last visit was:   Lab Results  Component Value Date   CHOL 180 02/18/2019   HDL 72 02/18/2019   LDLCALC 92 02/18/2019   TRIG 75 02/18/2019   CHOLHDL 2.5 02/18/2019   Last R1V in the office was:  Lab Results  Component Value Date   HGBA1C 5.5 12/07/2013   Patient is on Vitamin D supplement, typically takes 40086 IU daily but stopped in the last week.  Lab Results  Component Value Date   VD25OH 68 09/01/2020      Current Medications:  Current Outpatient Medications on File Prior to Visit  Medication Sig   Ascorbic Acid (VITAMIN C) 1000 MG tablet Take 1,000 mg by mouth daily.   buPROPion (WELLBUTRIN XL) 150 MG 24 hr tablet TAKE 1 TABLET BY MOUTH EVERY DAY IN THE MORNING   Cholecalciferol (VITAMIN D3) 250 MCG (10000 UT) TABS Take by mouth daily.   Cyanocobalamin (B-12 PO) Take by mouth daily.   Ibuprofen 200 MG CAPS Take 200 mg by mouth as needed.   EPINEPHrine 0.3 mg/0.3 mL IJ SOAJ injection Inject 0.3 mg into the muscle as needed for anaphylaxis. (Patient not taking: Reported on 10/05/2020)   No current facility-administered medications on file prior to visit.   Health Maintenance:   Immunization History  Administered Date(s) Administered   HPV Quadrivalent 07/19/2005, 10/09/2005, 01/17/2006   Moderna Sars-Covid-2 Vaccination 06/11/2019, 07/09/2019   Tdap 09/17/2007, 01/15/2018   Tetanus: 2019 Due 2029 Pneumovax: Not due Flu vaccine: declines Gardasil X 3 last one 2007 Covid 19: 2/2 - send covid vaccine infor  LMP: 09/28/21 Pap: normal PAP 08/30/2016 neg HPV MGM: 04/2021 - [facility] had [place] every 6 week, never changed, does not feel the need  for continual screenings d/t no changes in breast tissue.  Colonoscopy: Due at age 42 2028 EGD: CT head 2011  Dentist: Every 6 mo Dentist [facility]  Derm: Derm - Does not follow - she is watching  a mole on her chin that does not appear suspicious at this time. Eye Care: Does not follow   Patient Care Team: Lucky Cowboy, MD as PCP - General (Internal Medicine)  Medical History:  Past Medical History:  Diagnosis Date   Chronic fatigue    Migraines      Allergies Allergies  Allergen Reactions   Other Anaphylaxis    Honey bees    SURGICAL HISTORY She  has a past surgical history that includes Closed reduction clavicle fracture (Right, 2011).   FAMILY HISTORY Her family history includes Alzheimer's disease in her paternal great-grandfather; Cancer in her father and paternal grandfather; Chronic fatigue in  her father; Dementia in her maternal grandfather; Heart attack in her paternal grandfather; Heart disease (age of onset: 65) in her father; Hypertension in her paternal grandfather; Stroke in her paternal grandfather.   SOCIAL HISTORY She  reports that she has never smoked. She has never used smokeless tobacco. She reports current alcohol use of about 7.0 standard drinks of alcohol per week. She reports that she does not use drugs.   Review of Systems  Constitutional:  Positive for malaise/fatigue. Negative for chills, diaphoresis, fever and weight loss.  HENT:  Negative for congestion, ear discharge, ear pain, hearing loss, nosebleeds, sore throat and tinnitus.   Eyes:  Negative for blurred vision and double vision.  Respiratory: Negative.  Negative for cough, sputum production, shortness of breath, wheezing and stridor.   Cardiovascular: Negative.  Negative for chest pain, palpitations, orthopnea, claudication, leg swelling and PND.  Gastrointestinal: Negative.  Negative for abdominal pain, blood in stool, constipation, diarrhea, heartburn, melena,  nausea and vomiting.  Genitourinary: Negative.   Musculoskeletal: Negative.  Negative for falls, joint pain and myalgias.  Skin: Negative.  Negative for rash.  Neurological:  Negative for dizziness, tingling, tremors, sensory change, speech change, focal weakness, seizures, loss of consciousness, weakness and headaches.  Endo/Heme/Allergies:  Negative for polydipsia.  Psychiatric/Behavioral:  Positive for depression. Negative for hallucinations, memory loss, substance abuse and suicidal ideas. The patient is not nervous/anxious and does not have insomnia.   All other systems reviewed and are negative.    Physical Exam: Estimated body mass index is 21.97 kg/m as calculated from the following:   Height as of this encounter: 5\' 5"  (1.651 m).   Weight as of this encounter: 132 lb (59.9 kg). BP 100/60   Pulse 83   Temp 97.8 F (36.6 C)   Resp 16   Ht 5\' 5"  (1.651 m)   Wt 132 lb (59.9 kg)   SpO2 98%   BMI 21.97 kg/m  General Appearance: Well nourished, in no apparent distress.  Eyes: PERRLA, EOMs, conjunctiva no swelling or erythema, normal fundi and vessels.  Sinuses: No Frontal/maxillary tenderness  ENT/Mouth: Ext aud canals clear, normal light reflex with TMs without erythema, bulging. Good dentition. No erythema, swelling, or exudate on post pharynx. Tonsils not swollen or erythematous. Hearing normal.  Neck: Supple, thyroid normal. No bruits  Respiratory: Respiratory effort normal, BS equal bilaterally without rales, rhonchi, wheezing or stridor.  Cardio: RRR without murmurs, rubs or gallops. Brisk peripheral pulses without edema.  Chest: symmetric, with normal excursions and percussion.  Breasts: Defer to GYN, no concerns Abdomen: Soft, nontender, no guarding, rebound, hernias, masses, or organomegaly.   Lymphatics: Non tender without lymphadenopathy.  Genitourinary: defer to GYN Musculoskeletal: Full ROM all peripheral extremities,5/5 strength, and normal gait.  Skin: Right  lower chin line with raised round skin colored mole, light brown center.  No scaling, dryness or scab. Warm, dry without rashes, lesions, ecchymosis. Neuro: Cranial nerves intact, reflexes equal bilaterally. Normal muscle tone, no cerebellar symptoms. Sensation intact.  Psych: Awake and oriented X 3, normal/briefly tearful affect, Insight and Judgment appropriate.     EKG: defer at this itme.  sinus bradycardia with arrythmia, RSR' reviewed - no new concerns, fatigue is ongoing, defers today - will plan to complete next CPE   , NP 6:06 PM [facility]

## 2021-10-05 LAB — CBC WITH DIFFERENTIAL/PLATELET
Absolute Monocytes: 410 cells/uL (ref 200–950)
Basophils Absolute: 30 cells/uL (ref 0–200)
Basophils Relative: 0.4 %
Eosinophils Absolute: 53 cells/uL (ref 15–500)
Eosinophils Relative: 0.7 %
HCT: 44.1 % (ref 35.0–45.0)
Hemoglobin: 15.2 g/dL (ref 11.7–15.5)
Lymphs Abs: 2371 cells/uL (ref 850–3900)
MCH: 32.9 pg (ref 27.0–33.0)
MCHC: 34.5 g/dL (ref 32.0–36.0)
MCV: 95.5 fL (ref 80.0–100.0)
MPV: 11.8 fL (ref 7.5–12.5)
Monocytes Relative: 5.4 %
Neutro Abs: 4735 cells/uL (ref 1500–7800)
Neutrophils Relative %: 62.3 %
Platelets: 192 10*3/uL (ref 140–400)
RBC: 4.62 10*6/uL (ref 3.80–5.10)
RDW: 12.7 % (ref 11.0–15.0)
Total Lymphocyte: 31.2 %
WBC: 7.6 10*3/uL (ref 3.8–10.8)

## 2021-10-05 LAB — URINALYSIS, ROUTINE W REFLEX MICROSCOPIC
Bilirubin Urine: NEGATIVE
Glucose, UA: NEGATIVE
Hgb urine dipstick: NEGATIVE
Ketones, ur: NEGATIVE
Leukocytes,Ua: NEGATIVE
Nitrite: NEGATIVE
Protein, ur: NEGATIVE
Specific Gravity, Urine: 1.004 (ref 1.001–1.035)
pH: 6.5 (ref 5.0–8.0)

## 2021-10-05 LAB — EPSTEIN-BARR VIRUS VCA ANTIBODY PANEL
EBV NA IgG: >600 U/mL — ABNORMAL HIGH
EBV VCA IgG: >750 U/mL — ABNORMAL HIGH
EBV VCA IgM: <36 U/mL

## 2021-10-05 LAB — COMPLETE METABOLIC PANEL WITH GFR
AG Ratio: 2.4 (calc) (ref 1.0–2.5)
ALT: 11 U/L (ref 6–29)
AST: 15 U/L (ref 10–30)
Albumin: 5.2 g/dL — ABNORMAL HIGH (ref 3.6–5.1)
Alkaline phosphatase (APISO): 56 U/L (ref 31–125)
BUN: 9 mg/dL (ref 7–25)
CO2: 26 mmol/L (ref 20–32)
Calcium: 10 mg/dL (ref 8.6–10.2)
Chloride: 101 mmol/L (ref 98–110)
Creat: 0.85 mg/dL (ref 0.50–0.99)
Globulin: 2.2 g/dL (calc) (ref 1.9–3.7)
Glucose, Bld: 84 mg/dL (ref 65–99)
Potassium: 4 mmol/L (ref 3.5–5.3)
Sodium: 140 mmol/L (ref 135–146)
Total Bilirubin: 0.3 mg/dL (ref 0.2–1.2)
Total Protein: 7.4 g/dL (ref 6.1–8.1)
eGFR: 89 mL/min/{1.73_m2} (ref >=60)

## 2021-10-05 LAB — LIPID PANEL
Cholesterol: 185 mg/dL (ref <200)
HDL: 73 mg/dL (ref >=50)
LDL Cholesterol (Calc): 95 mg/dL (calc)
Non-HDL Cholesterol (Calc): 112 mg/dL (calc) (ref <130)
Total CHOL/HDL Ratio: 2.5 (calc) (ref <5.0)
Triglycerides: 82 mg/dL (ref <150)

## 2021-10-05 LAB — CYCLIC CITRUL PEPTIDE ANTIBODY, IGG: Cyclic Citrullin Peptide Ab: <16 UNITS

## 2021-10-05 LAB — HEMOGLOBIN A1C
Hgb A1c MFr Bld: 4.9 % of total Hgb (ref <5.7)
Mean Plasma Glucose: 94 mg/dL
eAG (mmol/L): 5.2 mmol/L

## 2021-10-05 LAB — MAGNESIUM: Magnesium: 2.3 mg/dL (ref 1.5–2.5)

## 2021-10-05 LAB — TSH: TSH: 1.53 mIU/L

## 2021-10-05 LAB — ANTI-SMITH ANTIBODY: ENA SM Ab Ser-aCnc: <1 AI

## 2021-10-05 LAB — VITAMIN D 25 HYDROXY (VIT D DEFICIENCY, FRACTURES): Vit D, 25-Hydroxy: 34 ng/mL (ref 30–100)

## 2021-11-06 ENCOUNTER — Other Ambulatory Visit: Payer: Self-pay

## 2021-11-06 MED ORDER — BUPROPION HCL ER (XL) 150 MG PO TB24: ORAL_TABLET | ORAL | 3 refills | Status: DC

## 2021-12-11 ENCOUNTER — Other Ambulatory Visit: Payer: Self-pay | Admitting: Nurse Practitioner

## 2022-03-28 ENCOUNTER — Other Ambulatory Visit: Payer: Self-pay | Admitting: Nurse Practitioner

## 2022-06-07 ENCOUNTER — Other Ambulatory Visit: Payer: Self-pay | Admitting: Nurse Practitioner

## 2022-10-04 ENCOUNTER — Encounter: Payer: Self-pay | Admitting: Nurse Practitioner

## 2023-03-06 ENCOUNTER — Ambulatory Visit (INDEPENDENT_AMBULATORY_CARE_PROVIDER_SITE_OTHER): Payer: 59 | Admitting: Nurse Practitioner

## 2023-03-06 VITALS — BP 108/70 | HR 80 | Temp 97.8°F | Resp 17 | Ht 64.5 in | Wt 132.8 lb

## 2023-03-06 DIAGNOSIS — Z0000 Encounter for general adult medical examination without abnormal findings: Secondary | ICD-10-CM

## 2023-03-06 DIAGNOSIS — E559 Vitamin D deficiency, unspecified: Secondary | ICD-10-CM

## 2023-03-06 DIAGNOSIS — Z79899 Other long term (current) drug therapy: Secondary | ICD-10-CM

## 2023-03-06 DIAGNOSIS — Z1329 Encounter for screening for other suspected endocrine disorder: Secondary | ICD-10-CM

## 2023-03-06 DIAGNOSIS — G43831 Menstrual migraine, intractable, with status migrainosus: Secondary | ICD-10-CM

## 2023-03-06 DIAGNOSIS — Z0001 Encounter for general adult medical examination with abnormal findings: Secondary | ICD-10-CM

## 2023-03-06 DIAGNOSIS — R5382 Chronic fatigue, unspecified: Secondary | ICD-10-CM

## 2023-03-06 DIAGNOSIS — M2559 Pain in other specified joint: Secondary | ICD-10-CM

## 2023-03-06 DIAGNOSIS — Z1389 Encounter for screening for other disorder: Secondary | ICD-10-CM

## 2023-03-06 DIAGNOSIS — Z8261 Family history of arthritis: Secondary | ICD-10-CM

## 2023-03-06 DIAGNOSIS — Z13220 Encounter for screening for lipoid disorders: Secondary | ICD-10-CM | POA: Diagnosis not present

## 2023-03-06 DIAGNOSIS — Z131 Encounter for screening for diabetes mellitus: Secondary | ICD-10-CM

## 2023-03-06 DIAGNOSIS — F32A Depression, unspecified: Secondary | ICD-10-CM

## 2023-03-06 NOTE — Progress Notes (Signed)
Complete Physical  Assessment and Plan:  Encounter for Annual Physical Exam with abnormal findings Due annually  Health Maintenance reviewed Healthy lifestyle reviewed and goals set  Intractable menstrual migraine with status migrainosus Improved off BCP, manages with excedrine PRN, has nurtec samples that work very well if needed Stay well hydrated Discussed magnesium supplement.  Screening for blood or protein in urine Urinalysis, Routine w reflex microscopic  Medication management All medications discussed and reviewed in full. All questions and concerns regarding medications addressed.    Depression  Increase Wellbutrin to 150 mg BID Stress management techniques discussed, increase water, good sleep hygiene discussed, increase exercise, and increase veggies.   Family hx of RA/arthralgias/fatigue No improvement, worsening. ANA Positive  Fairly low titer, does have family hx of autoimmune Continue to monitor per patient request   Family history of thyroid disease -     TSH  Vitamin D deficiency -     Vitamin D (25 hydroxy)  Screening for cholesterol level Discussed lifestyle modifications. Recommended diet heavy in fruits and veggies, omega 3's. Decrease consumption of animal meats, cheeses, and dairy products. Remain active and exercise as tolerated. Continue to monitor. Check lipids/TSH  Screening for DM Education: Reviewed 'ABCs' of diabetes management  Discussed goals to be met and/or maintained include A1C (<7) Blood pressure (<130/80) Cholesterol (LDL <70) Continue Eye Exam yearly  Continue Dental Exam Q6 mo Discussed dietary recommendations Discussed Physical Activity recommendations Check A1C  Orders Placed This Encounter  Procedures   CBC with Differential/Platelet   COMPLETE METABOLIC PANEL WITH GFR   Lipid panel   TSH   Hemoglobin A1c   Insulin, random   VITAMIN D 25 Hydroxy (Vit-D Deficiency, Fractures)   Urinalysis, Routine w reflex  microscopic   Microalbumin / creatinine urine ratio    Notify office for further evaluation and treatment, questions or concerns if any reported s/s fail to improve.   The patient was advised to call back or seek an in-person evaluation if any symptoms worsen or if the condition fails to improve as anticipated.   Further disposition pending results of labs. Discussed med's effects and SE's.    I discussed the assessment and treatment plan with the patient. The patient was provided an opportunity to ask questions and all were answered. The patient agreed with the plan and demonstrated an understanding of the instructions.  Discussed med's effects and SE's. Screening labs and tests as requested with regular follow-up as recommended.  I provided 30 minutes of face-to-face time during this encounter including counseling, chart review, and critical decision making was preformed.  Today's Plan of Care is based on a patient-centered health care approach known as shared decision making - the decisions, tests and treatments allow for patient preferences and values to be balanced with clinical evidence.    ______________________________________  HPI  43 y.o. female  presents for a complete physical. She has Headache, menstrual migraine; At risk for prolonged QT interval syndrome; Vitamin D deficiency; Chronic fatigue; Depression; Bee sting allergy; and ANA positive on their problem list.   Overall she reports feeling well today.  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  Has decreased her running due to increase in entrepreneurship and staying busy.  Has noticed increase in stress.  She is trying to  manage with daily yoga.    She is back on the nuvaring recently via  GYN Dr. Rosemary Holms. Has some menstrual migraines, does well with early Excedrin.   She reports  ongoing fatigue that has increased over the year, has had since high school. She reports wakes up fatigued.  Had a positive ANA last year, fairly low, no other work up.   Notably father was dx with chronic fatigue syndrome, MGGM had hx of RA. She does note intermittent joint pains in hands and feet, stiffness though not consistent. Practices yoga for 1 hour daily.  BMI is Body mass index is 22.44 kg/m., BMI is Body mass index is 22.57 kg/m., she is working on diet and exercise.  Wt Readings from Last 3 Encounters:  03/06/23 132 lb 12.8 oz (60.2 kg)  10/03/21 132 lb (59.9 kg)  10/05/20 132 lb 12.8 oz (60.2 kg)   Regular cycles, not currently on iron Lab Results  Component Value Date   IRON 159 09/01/2020   TIBC 355 09/01/2020   FERRITIN 35 09/01/2020   She was on B12 and folic acid combo but stopped in the past year:  Lab Results  Component Value Date   VITAMINB12 347 09/01/2020   She is not on biotin, mom being treated for hypothyroidism. Lab Results  Component Value Date   TSH 1.53 10/03/2021   Her blood pressure has been controlled at home, today their BP is BP: 108/70 She does workout, she has long hx of running 50-100 miles a month, but less this past year with fatigue and business.  She denies chest pain, shortness of breath, dizziness.   She is not on cholesterol medication and denies myalgias. Her cholesterol is at goal. The cholesterol last visit was:   Lab Results  Component Value Date   CHOL 185 10/03/2021   HDL 73 10/03/2021   LDLCALC 95 10/03/2021   TRIG 82 10/03/2021   CHOLHDL 2.5 10/03/2021   Last W0J in the office was:  Lab Results  Component Value Date   HGBA1C 4.9 10/03/2021   Patient is on Vitamin D supplement, typically takes 81191 IU daily but stopped in the last week.  Lab Results  Component Value Date   VD25OH 34 10/03/2021      Current Medications:  Current Outpatient Medications on File Prior to Visit  Medication Sig   buPROPion  (WELLBUTRIN XL) 150 MG 24 hr tablet TAKE 1 TABLET BY MOUTH EVERY DAY IN THE MORNING   Ascorbic Acid (VITAMIN C) 1000 MG tablet Take 1,000 mg by mouth daily. (Patient not taking: Reported on 03/06/2023)   Cholecalciferol (VITAMIN D3) 250 MCG (10000 UT) TABS Take by mouth daily. (Patient not taking: Reported on 03/06/2023)   Cyanocobalamin (B-12 PO) Take by mouth daily. (Patient not taking: Reported on 03/06/2023)   EPINEPHrine 0.3 mg/0.3 mL IJ SOAJ injection Inject 0.3 mg into the muscle as needed for anaphylaxis. (Patient not taking: Reported on 03/06/2023)   Ibuprofen 200 MG CAPS Take 200 mg by mouth as needed. (Patient not taking: Reported on 03/06/2023)   No current facility-administered medications on file prior to visit.   Health Maintenance:   Immunization History  Administered Date(s) Administered   HPV Quadrivalent 07/19/2005, 10/09/2005, 01/17/2006   Moderna Sars-Covid-2 Vaccination 06/11/2019, 07/09/2019   Tdap 09/17/2007, 01/15/2018   Tetanus: 2019 Due 2029 Pneumovax: Not due Flu vaccine: declines Gardasil X 3 last one 2007 Covid 19: 2/2 - send covid vaccine infor  LMP: 09/28/21 Pap: normal PAP 08/30/2016 neg HPV MGM: 04/2021 - [facility] had [place] every  6 week, never changed, does not feel the need for continual screenings d/t no changes in breast tissue.  Colonoscopy: Due at age 51 2028 EGD: CT head 2011  Dentist: Every 6 mo Dentist [facility]  Derm: Derm - Does not follow - she is watching  a mole on her chin that does not appear suspicious at this time. Eye Care: Does not follow   Patient Care Team: Lucky Cowboy, MD as PCP - General (Internal Medicine)  Medical History:  Past Medical History:  Diagnosis Date   Chronic fatigue    Migraines      Allergies Allergies  Allergen Reactions   Other Anaphylaxis    Honey bees    SURGICAL HISTORY She  has a past surgical history that includes Closed reduction  clavicle fracture (Right, 2011).   FAMILY HISTORY Her family history includes Alzheimer's disease in her paternal great-grandfather; Cancer in her father and paternal grandfather; Chronic fatigue in her father; Dementia in her maternal grandfather; Heart attack in her paternal grandfather; Heart disease (age of onset: 20) in her father; Hypertension in her paternal grandfather; Stroke in her paternal grandfather.   SOCIAL HISTORY She  reports that she has never smoked. She has never used smokeless tobacco. She reports current alcohol use of about 7.0 standard drinks of alcohol per week. She reports that she does not use drugs.   Review of Systems  Constitutional:  Positive for malaise/fatigue. Negative for chills, diaphoresis, fever and weight loss.  HENT:  Negative for congestion, ear discharge, ear pain, hearing loss, nosebleeds, sore throat and tinnitus.   Eyes:  Negative for blurred vision and double vision.  Respiratory: Negative.  Negative for cough, sputum production, shortness of breath, wheezing and stridor.   Cardiovascular: Negative.  Negative for chest pain, palpitations, orthopnea, claudication, leg swelling and PND.  Gastrointestinal: Negative.  Negative for abdominal pain, blood in stool, constipation, diarrhea, heartburn, melena, nausea and vomiting.  Genitourinary: Negative.   Musculoskeletal: Negative.  Negative for falls, joint pain and myalgias.  Skin: Negative.  Negative for rash.  Neurological:  Negative for dizziness, tingling, tremors, sensory change, speech change, focal weakness, seizures, loss of consciousness, weakness and headaches.  Endo/Heme/Allergies:  Negative for polydipsia.  Psychiatric/Behavioral:  Positive for depression. Negative for hallucinations, memory loss, substance abuse and suicidal ideas. The patient is not nervous/anxious and does not have insomnia.   All other systems reviewed and are negative.    Physical Exam: Estimated body mass index is  22.44 kg/m as calculated from the following:   Height as of this encounter: 5' 4.5" (1.638 m).   Weight as of this encounter: 132 lb 12.8 oz (60.2 kg). BP 108/70   Pulse 80   Temp 97.8 F (36.6 C)   Resp 17   Ht 5' 4.5" (1.638 m)   Wt 132 lb 12.8 oz (60.2 kg)   SpO2 99%   BMI 22.44 kg/m  General Appearance: Well nourished, in no apparent distress.  Eyes: PERRLA, EOMs, conjunctiva no swelling or erythema, normal fundi and vessels.  Sinuses: No Frontal/maxillary tenderness  ENT/Mouth: Ext aud canals clear, normal light reflex with TMs without erythema, bulging. Good dentition. No erythema, swelling, or exudate on post pharynx. Tonsils not swollen or erythematous. Hearing normal.  Neck: Supple, thyroid normal. No bruits  Respiratory: Respiratory effort normal, BS equal bilaterally without rales, rhonchi, wheezing or stridor.  Cardio: RRR without murmurs, rubs or gallops. Brisk peripheral pulses without edema.  Chest: symmetric, with normal excursions and  percussion.  Breasts: Defer to GYN, no concerns Abdomen: Soft, nontender, no guarding, rebound, hernias, masses, or organomegaly.   Lymphatics: Non tender without lymphadenopathy.  Genitourinary: defer to GYN Musculoskeletal: Full ROM all peripheral extremities,5/5 strength, and normal gait.  Skin: Right lower chin line with raised round skin colored mole, light brown center.  No scaling, dryness or scab. Warm, dry without rashes, lesions, ecchymosis. Neuro: Cranial nerves intact, reflexes equal bilaterally. Normal muscle tone, no cerebellar symptoms. Sensation intact.  Psych: Awake and oriented X 3, normal/briefly tearful affect, Insight and Judgment appropriate.     EKG: defer at this itme.  sinus bradycardia with arrythmia, RSR' reviewed - no new concerns, fatigue is ongoing, defers today - will plan to complete next CPE   Adela Glimpse, NP 10:11 AM [facility]

## 2023-03-07 ENCOUNTER — Encounter: Payer: Self-pay | Admitting: Nurse Practitioner

## 2023-03-07 LAB — URINALYSIS, ROUTINE W REFLEX MICROSCOPIC
Bilirubin Urine: NEGATIVE
Glucose, UA: NEGATIVE
Hgb urine dipstick: NEGATIVE
Leukocytes,Ua: NEGATIVE
Nitrite: NEGATIVE
Protein, ur: NEGATIVE
Specific Gravity, Urine: 1.011 (ref 1.001–1.035)
pH: 5.5 (ref 5.0–8.0)

## 2023-03-07 LAB — CBC WITH DIFFERENTIAL/PLATELET
Absolute Lymphocytes: 1736 {cells}/uL (ref 850–3900)
Absolute Monocytes: 370 {cells}/uL (ref 200–950)
Basophils Absolute: 20 {cells}/uL (ref 0–200)
Basophils Relative: 0.3 %
Eosinophils Absolute: 33 {cells}/uL (ref 15–500)
Eosinophils Relative: 0.5 %
HCT: 44.2 % (ref 35.0–45.0)
Hemoglobin: 15 g/dL (ref 11.7–15.5)
MCH: 32.4 pg (ref 27.0–33.0)
MCHC: 33.9 g/dL (ref 32.0–36.0)
MCV: 95.5 fL (ref 80.0–100.0)
MPV: 11.7 fL (ref 7.5–12.5)
Monocytes Relative: 5.6 %
Neutro Abs: 4442 {cells}/uL (ref 1500–7800)
Neutrophils Relative %: 67.3 %
Platelets: 190 10*3/uL (ref 140–400)
RBC: 4.63 10*6/uL (ref 3.80–5.10)
RDW: 12.6 % (ref 11.0–15.0)
Total Lymphocyte: 26.3 %
WBC: 6.6 10*3/uL (ref 3.8–10.8)

## 2023-03-07 LAB — HEMOGLOBIN A1C
Hgb A1c MFr Bld: 5.1 %{Hb} (ref <5.7)
Mean Plasma Glucose: 100 mg/dL
eAG (mmol/L): 5.5 mmol/L

## 2023-03-07 LAB — COMPLETE METABOLIC PANEL WITH GFR
AG Ratio: 2.3 (calc) (ref 1.0–2.5)
ALT: 11 U/L (ref 6–29)
AST: 14 U/L (ref 10–30)
Albumin: 5.1 g/dL (ref 3.6–5.1)
Alkaline phosphatase (APISO): 45 U/L (ref 31–125)
BUN: 8 mg/dL (ref 7–25)
CO2: 26 mmol/L (ref 20–32)
Calcium: 9.7 mg/dL (ref 8.6–10.2)
Chloride: 101 mmol/L (ref 98–110)
Creat: 0.8 mg/dL (ref 0.50–0.99)
Globulin: 2.2 g/dL (ref 1.9–3.7)
Glucose, Bld: 77 mg/dL (ref 65–99)
Potassium: 4.1 mmol/L (ref 3.5–5.3)
Sodium: 139 mmol/L (ref 135–146)
Total Bilirubin: 0.6 mg/dL (ref 0.2–1.2)
Total Protein: 7.3 g/dL (ref 6.1–8.1)
eGFR: 94 mL/min/{1.73_m2} (ref >=60)

## 2023-03-07 LAB — MICROALBUMIN / CREATININE URINE RATIO
Creatinine, Urine: 58 mg/dL (ref 20–275)
Microalb, Ur: <0.2 mg/dL

## 2023-03-07 LAB — LIPID PANEL
Cholesterol: 197 mg/dL (ref <200)
HDL: 74 mg/dL (ref >=50)
LDL Cholesterol (Calc): 106 mg/dL — ABNORMAL HIGH
Non-HDL Cholesterol (Calc): 123 mg/dL (ref <130)
Total CHOL/HDL Ratio: 2.7 (calc) (ref <5.0)
Triglycerides: 83 mg/dL (ref <150)

## 2023-03-07 LAB — TSH: TSH: 1.04 m[IU]/L

## 2023-03-07 LAB — INSULIN, RANDOM: Insulin: 3.8 u[IU]/mL

## 2023-03-07 LAB — VITAMIN D 25 HYDROXY (VIT D DEFICIENCY, FRACTURES): Vit D, 25-Hydroxy: 37 ng/mL (ref 30–100)

## 2023-03-11 ENCOUNTER — Encounter: Payer: Self-pay | Admitting: Nurse Practitioner

## 2023-03-11 NOTE — Patient Instructions (Signed)

## 2023-05-13 DIAGNOSIS — Z01411 Encounter for gynecological examination (general) (routine) with abnormal findings: Secondary | ICD-10-CM | POA: Diagnosis not present

## 2023-05-13 DIAGNOSIS — Z1331 Encounter for screening for depression: Secondary | ICD-10-CM | POA: Diagnosis not present

## 2023-05-13 DIAGNOSIS — Z124 Encounter for screening for malignant neoplasm of cervix: Secondary | ICD-10-CM | POA: Diagnosis not present

## 2023-05-13 DIAGNOSIS — Z01419 Encounter for gynecological examination (general) (routine) without abnormal findings: Secondary | ICD-10-CM | POA: Diagnosis not present

## 2023-05-13 DIAGNOSIS — Z113 Encounter for screening for infections with a predominantly sexual mode of transmission: Secondary | ICD-10-CM | POA: Diagnosis not present

## 2023-05-13 DIAGNOSIS — Z1231 Encounter for screening mammogram for malignant neoplasm of breast: Secondary | ICD-10-CM | POA: Diagnosis not present

## 2023-05-15 ENCOUNTER — Ambulatory Visit
Admission: RE | Admit: 2023-05-15 | Discharge: 2023-05-15 | Disposition: A | Discharge status: Home / Self Care | Source: Ambulatory Visit | Attending: Obstetrics and Gynecology | Admitting: Obstetrics and Gynecology

## 2023-05-15 ENCOUNTER — Other Ambulatory Visit: Payer: Self-pay | Admitting: Obstetrics and Gynecology

## 2023-05-15 ENCOUNTER — Ambulatory Visit
Admission: RE | Admit: 2023-05-15 | Discharge: 2023-05-15 | Disposition: A | Discharge status: Home / Self Care | Source: Ambulatory Visit | Attending: Obstetrics and Gynecology

## 2023-05-15 DIAGNOSIS — N6322 Unspecified lump in the left breast, upper inner quadrant: Secondary | ICD-10-CM | POA: Diagnosis not present

## 2023-05-15 DIAGNOSIS — N6320 Unspecified lump in the left breast, unspecified quadrant: Secondary | ICD-10-CM

## 2023-05-15 DIAGNOSIS — R928 Other abnormal and inconclusive findings on diagnostic imaging of breast: Secondary | ICD-10-CM

## 2023-05-15 DIAGNOSIS — N6321 Unspecified lump in the left breast, upper outer quadrant: Secondary | ICD-10-CM | POA: Diagnosis not present

## 2023-05-16 ENCOUNTER — Ambulatory Visit
Admission: RE | Admit: 2023-05-16 | Discharge: 2023-05-16 | Disposition: A | Discharge status: Home / Self Care | Source: Ambulatory Visit | Attending: Obstetrics and Gynecology | Admitting: Obstetrics and Gynecology

## 2023-05-16 DIAGNOSIS — C50812 Malignant neoplasm of overlapping sites of left female breast: Secondary | ICD-10-CM | POA: Diagnosis not present

## 2023-05-16 DIAGNOSIS — N6325 Unspecified lump in the left breast, overlapping quadrants: Secondary | ICD-10-CM | POA: Diagnosis not present

## 2023-05-16 DIAGNOSIS — N6320 Unspecified lump in the left breast, unspecified quadrant: Secondary | ICD-10-CM

## 2023-05-16 DIAGNOSIS — N6322 Unspecified lump in the left breast, upper inner quadrant: Secondary | ICD-10-CM | POA: Diagnosis not present

## 2023-05-16 DIAGNOSIS — N6321 Unspecified lump in the left breast, upper outer quadrant: Secondary | ICD-10-CM | POA: Diagnosis not present

## 2023-05-16 HISTORY — PX: BREAST BIOPSY: SHX20

## 2023-05-17 ENCOUNTER — Encounter: Payer: Self-pay | Admitting: Licensed Clinical Social Worker

## 2023-05-17 LAB — SURGICAL PATHOLOGY

## 2023-05-17 NOTE — Progress Notes (Signed)
 [facility] Clinical Social Work  Clinical Social Work was referred by Statistician from the breast center Lelan Pons) for assessment of psychosocial needs.  Clinical Social Worker contacted patient by phone to offer support and assess for needs.   Patient is in shock with diagnosis and still has limited information as she has not seen oncology yet.  Pt is self-employed (life Event organiser) and will not have income if she is not working.  She lives alone.  Pt has a fairly high OOP maximum.  CSW discussed various cancer foundations and that, once pt has a treatment plan, applications can be completed.  CSW sent the information to pt via e-mail today.  Also discussed that, if she accrues a significant amount due to a health system, she can speak with billing regarding any system financial assistance available.   Informed pt of other support services available. No questions at this time.  CSW provided direct contact information.     Geoffrey Mankin E Shem Plemmons, LCSW  Clinical Social Worker [facility]

## 2023-05-20 ENCOUNTER — Telehealth: Payer: Self-pay | Admitting: *Deleted

## 2023-05-20 NOTE — Telephone Encounter (Signed)
 LVM to patient in reference to upcoming appointment, left my contact to call back and emailed packet for [facility] on 4/23 arrival of 830

## 2023-05-27 ENCOUNTER — Encounter: Payer: Self-pay | Admitting: Genetic Counselor

## 2023-05-27 ENCOUNTER — Encounter: Payer: Self-pay | Admitting: *Deleted

## 2023-05-27 DIAGNOSIS — C50412 Malignant neoplasm of upper-outer quadrant of left female breast: Secondary | ICD-10-CM | POA: Insufficient documentation

## 2023-05-28 ENCOUNTER — Telehealth: Payer: Self-pay

## 2023-05-28 NOTE — Telephone Encounter (Signed)
 Spoke with patient and confirmed appointment on 4/23 at breast clinic.  Patient stated she did not have paperwork sent to her.. I advised that I would have it waiting for her upon arrival.  Patient confirmed and understood.

## 2023-05-29 ENCOUNTER — Encounter: Payer: Self-pay | Admitting: *Deleted

## 2023-05-29 ENCOUNTER — Inpatient Hospital Stay: Attending: Hematology and Oncology | Admitting: Licensed Clinical Social Worker

## 2023-05-29 ENCOUNTER — Encounter: Payer: Self-pay | Admitting: Physical Therapy

## 2023-05-29 ENCOUNTER — Inpatient Hospital Stay (HOSPITAL_BASED_OUTPATIENT_CLINIC_OR_DEPARTMENT_OTHER): Admitting: Genetic Counselor

## 2023-05-29 ENCOUNTER — Ambulatory Visit: Attending: General Surgery | Admitting: Physical Therapy

## 2023-05-29 ENCOUNTER — Ambulatory Visit
Admission: RE | Admit: 2023-05-29 | Discharge: 2023-05-29 | Disposition: A | Discharge status: Home / Self Care | Source: Ambulatory Visit | Attending: Radiation Oncology | Admitting: Radiation Oncology

## 2023-05-29 ENCOUNTER — Encounter: Payer: Self-pay | Admitting: Hematology and Oncology

## 2023-05-29 ENCOUNTER — Inpatient Hospital Stay (HOSPITAL_BASED_OUTPATIENT_CLINIC_OR_DEPARTMENT_OTHER): Admitting: Hematology and Oncology

## 2023-05-29 ENCOUNTER — Other Ambulatory Visit: Payer: Self-pay

## 2023-05-29 ENCOUNTER — Inpatient Hospital Stay

## 2023-05-29 ENCOUNTER — Encounter: Payer: Self-pay | Admitting: Genetic Counselor

## 2023-05-29 VITALS — BP 114/79 | HR 67 | Temp 98.1°F | Resp 16 | Ht 65.35 in | Wt 133.6 lb

## 2023-05-29 DIAGNOSIS — Z170 Estrogen receptor positive status [ER+]: Secondary | ICD-10-CM | POA: Insufficient documentation

## 2023-05-29 DIAGNOSIS — Z823 Family history of stroke: Secondary | ICD-10-CM | POA: Diagnosis not present

## 2023-05-29 DIAGNOSIS — Z79899 Other long term (current) drug therapy: Secondary | ICD-10-CM

## 2023-05-29 DIAGNOSIS — C50412 Malignant neoplasm of upper-outer quadrant of left female breast: Secondary | ICD-10-CM

## 2023-05-29 DIAGNOSIS — Z803 Family history of malignant neoplasm of breast: Secondary | ICD-10-CM

## 2023-05-29 DIAGNOSIS — Z8249 Family history of ischemic heart disease and other diseases of the circulatory system: Secondary | ICD-10-CM | POA: Diagnosis not present

## 2023-05-29 DIAGNOSIS — Z818 Family history of other mental and behavioral disorders: Secondary | ICD-10-CM

## 2023-05-29 DIAGNOSIS — Z800 Family history of malignant neoplasm of digestive organs: Secondary | ICD-10-CM | POA: Diagnosis not present

## 2023-05-29 DIAGNOSIS — Z7289 Other problems related to lifestyle: Secondary | ICD-10-CM | POA: Insufficient documentation

## 2023-05-29 DIAGNOSIS — M542 Cervicalgia: Secondary | ICD-10-CM | POA: Diagnosis not present

## 2023-05-29 DIAGNOSIS — Z808 Family history of malignant neoplasm of other organs or systems: Secondary | ICD-10-CM

## 2023-05-29 LAB — CBC WITH DIFFERENTIAL (CANCER CENTER ONLY)
Abs Immature Granulocytes: 0.01 10*3/uL (ref 0.00–0.07)
Basophils Absolute: 0 10*3/uL (ref 0.0–0.1)
Basophils Relative: 0 %
Eosinophils Absolute: 0 10*3/uL (ref 0.0–0.5)
Eosinophils Relative: 0 %
HCT: 40.9 % (ref 36.0–46.0)
Hemoglobin: 14.5 g/dL (ref 12.0–15.0)
Immature Granulocytes: 0 %
Lymphocytes Relative: 34 %
Lymphs Abs: 2.1 10*3/uL (ref 0.7–4.0)
MCH: 32.2 pg (ref 26.0–34.0)
MCHC: 35.5 g/dL (ref 30.0–36.0)
MCV: 90.7 fL (ref 80.0–100.0)
Monocytes Absolute: 0.3 10*3/uL (ref 0.1–1.0)
Monocytes Relative: 5 %
Neutro Abs: 3.8 10*3/uL (ref 1.7–7.7)
Neutrophils Relative %: 61 %
Platelet Count: 158 10*3/uL (ref 150–400)
RBC: 4.51 MIL/uL (ref 3.87–5.11)
RDW: 12.4 % (ref 11.5–15.5)
WBC Count: 6.3 10*3/uL (ref 4.0–10.5)
nRBC: 0 % (ref 0.0–0.2)

## 2023-05-29 LAB — GENETIC SCREENING ORDER

## 2023-05-29 LAB — CMP (CANCER CENTER ONLY)
ALT: 12 U/L (ref 0–44)
AST: 15 U/L (ref 15–41)
Albumin: 4.9 g/dL (ref 3.5–5.0)
Alkaline Phosphatase: 40 U/L (ref 38–126)
Anion gap: 6 (ref 5–15)
BUN: 5 mg/dL — ABNORMAL LOW (ref 6–20)
CO2: 27 mmol/L (ref 22–32)
Calcium: 9.3 mg/dL (ref 8.9–10.3)
Chloride: 106 mmol/L (ref 98–111)
Creatinine: 0.71 mg/dL (ref 0.44–1.00)
GFR, Estimated: >60 mL/min (ref >60)
Glucose, Bld: 82 mg/dL (ref 70–99)
Potassium: 4 mmol/L (ref 3.5–5.1)
Sodium: 139 mmol/L (ref 135–145)
Total Bilirubin: 0.5 mg/dL (ref 0.0–1.2)
Total Protein: 7.1 g/dL (ref 6.5–8.1)

## 2023-05-29 NOTE — Progress Notes (Signed)
 [facility] Clinical Social Work  Initial Assessment   Ashlee Pena is a 43 y.o. year old female accompanied by partner, Anda Bamberg. Clinical Social Work was referred by  [facility]  for assessment of psychosocial needs.   SDOH (Social Determinants of Health) assessments performed: Yes SDOH Interventions    Flowsheet Row Clinical Support from 05/29/2023 in [facility] - A Dept Of [place]. [facility] Office Visit from 10/05/2020 in [place] [facility] Office Visit from 09/01/2020 in [place] [facility]  SDOH Interventions     Food Insecurity Interventions Other (Comment)  [discussed community resources & [facility] food pantry,  cancer foundations] -- --  Housing Interventions Other (Comment)  [cancer foundations] -- --  Transportation Interventions Intervention Not Indicated -- --  Utilities Interventions Intervention Not Indicated -- --  Depression Interventions/Treatment  -- PHQ2-9 Score <4 Follow-up Not Indicated, Medication, Currently on Treatment Medication       SDOH Screenings   Food Insecurity: Food Insecurity Present (05/29/2023)  Housing: High Risk (05/29/2023)  Transportation Needs: No Transportation Needs (05/29/2023)  Utilities: Not At Risk (05/29/2023)  Depression (PHQ2-9): Low Risk  (05/29/2023)  Social Connections: Unknown (06/20/2021)   Received from [facility]  Tobacco Use: Low Risk  (05/29/2023)     Distress Screen completed: No     No data to display            Family/Social Information:  Housing Arrangement: patient lives alone. Her partner, Anda Bamberg, lives next door Family members/support persons in your life? 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  Income source: Educational psychologist concerns: Yes, due to illness and/or loss of work during treatment Type of concern: Medical bills (high OOP max- about $7500) Food access  concerns: yes, discussed food pantries Religious or spiritual practice: Not known Advanced directives: No Services Currently in place:  [employer]  Coping/ Adjustment to diagnosis: Patient understands treatment plan and what happens next? yes, she is still processing the information from today but understands the general plan for enhanced imaging, lumpectomy, oncotype, radiation, and AI Concerns about diagnosis and/or treatment: Losing my job and/or losing income and How I will pay for the services I need Patient reported stressors: Finances, Anxiety/ nervousness, and Adjusting to my illness Patient enjoys time with family/ friends and running Current coping skills/ strengths: Ability for insight , Capable of independent living , Communication skills , Motivation for treatment/growth , and Supportive family/friends     SUMMARY: Current SDOH Barriers:  Financial constraints related to increased medical bills and loss of work hours due to upcoming treatment Adjustment to cancer  Clinical Social Work Clinical Goal(s):  Explore community resource options for unmet needs related to:  Surveyor, quantity Strain  Patient will work with CSW to address concerns related to mental health/ emotional adjustment  Interventions: Discussed common feeling and emotions when being diagnosed with cancer, and the importance of support during treatment Informed patient of the support team roles and support services at [facility] Provided CSW contact information and encouraged patient to call with any questions or concerns Referred patient to [employer] Provided information on other cancer foundations for financial assistance. Discussed food pantries   Follow Up Plan: Patient will follow-up on financial applications. CSW and pt will coordinate to schedule counseling Patient verbalizes understanding of plan: Yes    Airel Magadan E Heidie Krall, LCSW Clinical Social Worker [facility]

## 2023-05-29 NOTE — Therapy (Signed)
 OUTPATIENT PHYSICAL THERAPY BREAST CANCER BASELINE EVALUATION   Patient Name: Ashlee Pena MRN: [id number] DOB:1980-09-11, 43 y.o., female Today's Date: 05/29/2023  END OF SESSION:  PT End of Session - 05/29/23 1316     Visit Number 1    Number of Visits 2    Date for PT Re-Evaluation 07/24/23    PT Start Time 0944    PT Stop Time 1015   Also saw pt from 1100-1110 for a total of 41 min   PT Time Calculation (min) 31 min    Activity Tolerance Patient tolerated treatment well    Behavior During Therapy Ashlee Pena for tasks assessed/performed             Past Medical History:  Diagnosis Date   Breast cancer (HCC)    Chronic fatigue    Migraines    Past Surgical History:  Procedure Laterality Date   BREAST BIOPSY Left 05/16/2023   US  LT BREAST BX W LOC DEV 1ST LESION IMG BX SPEC US  GUIDE 05/16/2023 Ashlee Pena   CLOSED REDUCTION CLAVICLE FRACTURE Right 2011   Patient Active Problem List   Diagnosis Date Noted   Malignant neoplasm of upper-outer quadrant of left breast in female, estrogen receptor positive (HCC) 05/27/2023   ANA positive 09/05/2020   Vitamin D  deficiency 09/01/2020   Chronic fatigue 09/01/2020   Depression 09/01/2020   Bee sting allergy  09/01/2020   At risk for prolonged QT interval syndrome 08/31/2019   Headache, menstrual migraine 09/01/2018   REFERRING PROVIDER: Dr. Enid Harry  REFERRING DIAG: Left breast cancer  THERAPY DIAG:  Malignant neoplasm of upper-outer quadrant of left breast in female, estrogen receptor positive (HCC)  Cervicalgia  Rationale for Evaluation and Treatment: Rehabilitation  ONSET DATE: 05/13/2023  SUBJECTIVE:                                                                                                                                                                                           SUBJECTIVE STATEMENT: Patient reports she is here today to be seen by her medical team for her newly diagnosed left  breast cancer.   PERTINENT HISTORY:  Patient was diagnosed on 05/13/2023 with left grade 2 invasive ductal carcinoma breast cancer. It measures 8 mm and is located in the upper outer quadrant. It is ER/PR positive and HER2 negative with a Ki67 of 1%. She had a clavicle fracture in 2010 which required surgery with a plate placed.  PATIENT GOALS:   reduce lymphedema risk and learn post op HEP.   PAIN:  Are you having pain? Yes: NPRS scale: varies Pain location: left upper trap Pain description: tight  Aggravating factors: trying to tilt head to the left Relieving factors: unknown  PRECAUTIONS: Active CA   RED FLAGS: None   HAND DOMINANCE: right  WEIGHT BEARING RESTRICTIONS: No  FALLS:  Has patient fallen in last 6 months? No  LIVING ENVIRONMENT: Patient lives with: alone but boyfriend lives next door Lives in: House/apartment Has following equipment at home: None  OCCUPATION: Medical illustrator; also teaches yoga  LEISURE: Walks 3-4x/week for 30-45 min and teaches yoga 3x/week  PRIOR LEVEL OF FUNCTION: Independent   OBJECTIVE: Note: Objective measures were completed at Evaluation unless otherwise noted.  COGNITION: Overall cognitive status: Within functional limits for tasks assessed    POSTURE:  Forward head and rounded shoulders posture  UPPER EXTREMITY AROM/PROM:  A/PROM RIGHT   eval   Shoulder extension 55  Shoulder flexion 163  Shoulder abduction 177  Shoulder internal rotation 59  Shoulder external rotation 90    (Blank rows = not tested)  A/PROM LEFT   eval  Shoulder extension 60  Shoulder flexion 155  Shoulder abduction 177  Shoulder internal rotation 62  Shoulder external rotation 90    (Blank rows = not tested)  CERVICAL AROM:   Percent limited  Flexion WNL  Extension WNL  Right lateral flexion 25% limited  Left lateral flexion 75% limited  Right rotation 25% limited  Left rotation 25% limited     UPPER EXTREMITY STRENGTH:  WNL  LYMPHEDEMA ASSESSMENTS (in cm):   LANDMARK RIGHT   eval  10 cm proximal to olecranon process 26.6  Olecranon process 23  10 cm proximal to ulnar styloid process 21  Just proximal to ulnar styloid process 14.5  Across hand at thumb web space 18.2  At base of 2nd digit 5.8  (Blank rows = not tested)  LANDMARK LEFT   eval  10 cm proximal to olecranon process 26.9  Olecranon process 23.5  10 cm proximal to ulnar styloid process 21  Just proximal to ulnar styloid process 14.4  Across hand at thumb web space 17.8  At base of 2nd digit 5.5  (Blank rows = not tested)  L-DEX LYMPHEDEMA SCREENING:  The patient was assessed using the L-Dex machine today to produce a lymphedema index baseline score. The patient will be reassessed on a regular basis (typically every 3 months) to obtain new L-Dex scores. If the score is > 6.5 points away from his/her baseline score indicating onset of subclinical lymphedema, it will be recommended to wear a compression garment for 4 weeks, 12 hours per day and then be reassessed. If the score continues to be > 6.5 points from baseline at reassessment, we will initiate lymphedema treatment. Assessing in this manner has a 95% rate of preventing clinically significant lymphedema.   L-DEX FLOWSHEETS - 05/29/23 1300       L-DEX LYMPHEDEMA SCREENING   Measurement Type Unilateral    L-DEX MEASUREMENT EXTREMITY Upper Extremity    POSITION  Standing    DOMINANT SIDE Right    At Risk Side Left    BASELINE SCORE (UNILATERAL) 3.7             QUICK DASH SURVEY:  Ashlee Pena - 05/29/23 0001     Open a tight or new jar No difficulty    Do heavy household chores (wash walls, wash floors) No difficulty    Carry a shopping bag or briefcase No difficulty    Wash your back No difficulty    Use a knife to cut food No  difficulty    Recreational activities in which you take some force or impact through your arm, shoulder, or hand (golf, hammering, tennis) No  difficulty    During the past week, to what extent has your arm, shoulder or hand problem interfered with your normal social activities with family, friends, neighbors, or groups? Not at all    During the past week, to what extent has your arm, shoulder or hand problem limited your work or other regular daily activities Not at all    Arm, shoulder, or hand pain. None    Tingling (pins and needles) in your arm, shoulder, or hand None    Difficulty Sleeping No difficulty    DASH Score 0 %              PATIENT EDUCATION:  Education details: Time spent educating patient on aspects of self-care to maximize post op recovery. Patient was educated on where and how to get a post op compression bra to use to reduce post op edema. Patient was also educated on the use of SOZO screenings and surveillance principles for early identification of lymphedema onset. She was instructed to use the post op pillow in the axilla for pressure and pain relief. Patient educated on lymphedema risk reduction and post op shoulder/posture HEP. Person educated: Patient Education method: Explanation, Demonstration, Handout Education comprehension: Patient verbalized understanding and returned demonstration  HOME EXERCISE PROGRAM: Patient was instructed today in a home exercise program today for post op shoulder range of motion. These included active assist shoulder flexion in sitting, scapular retraction, wall walking with shoulder abduction, and hands behind head external rotation.  She was encouraged to do these twice a day, holding 3 seconds and repeating 5 times when permitted by her physician.  TREATMENT: Performed cervical mobilizations with movement in sitting to C6-T1 to improve bil cervical rotation and left sidebending. Trigger point release to left upper trap. Cervical ROM slightly improved after mobilizations.  ASSESSMENT:  CLINICAL IMPRESSION: Patient reports she is here today to be seen by her medical  team for her newly diagnosed left breast cancer. Her multidisciplinary medical team met prior to her assessments to determine a recommended treatment plan. She is planning to have a left lumpectomy and sentinel node biopsy followed by radiation and anti-estrogen therapy. She will benefit from a post op PT reassessment to determine needs and from L-Dex screens every 3 months for 2 years to detect subclinical lymphedema.  Pt will benefit from skilled therapeutic intervention to improve on the following deficits: Decreased knowledge of precautions, impaired UE functional use, pain, decreased ROM, postural dysfunction.   PT treatment/interventions: ADL/self-care home management, pt/family education, therapeutic exercise  REHAB POTENTIAL: Excellent  CLINICAL DECISION MAKING: Stable/uncomplicated  EVALUATION COMPLEXITY: Low   GOALS: Goals reviewed with patient? YES  LONG TERM GOALS: (STG=LTG)    Name Target Date Goal status  1 Pt will be able to verbalize understanding of pertinent lymphedema risk reduction practices relevant to her dx specifically related to skin care.  Baseline:  No knowledge 05/29/2023 Achieved at eval  2 Pt will be able to return demo and/or verbalize understanding of the post op HEP related to regaining shoulder ROM. Baseline:  No knowledge 05/29/2023 Achieved at eval  3 Pt will be able to verbalize understanding of the importance of viewing the post op After Breast CA Class video for further lymphedema risk reduction education and therapeutic exercise.  Baseline:  No knowledge 05/29/2023 Achieved at eval  4 Pt will demo she  has regained full shoulder ROM and function post operatively compared to baselines.  Baseline: See objective measurements taken today. 07/24/2023     PLAN:  PT FREQUENCY/DURATION: EVAL and 1 follow up appointment.   PLAN FOR NEXT SESSION: will reassess 3-4 weeks post op to determine needs.   Patient will follow up at outpatient cancer rehab 3-4  weeks following surgery.  If the patient requires physical therapy at that time, a specific plan will be dictated and sent to the referring physician for approval. The patient was educated today on appropriate basic range of motion exercises to begin post operatively and the importance of viewing the After Breast Cancer class video following surgery.  Patient was educated today on lymphedema risk reduction practices as it pertains to recommendations that will benefit the patient immediately following surgery.  She verbalized good understanding.    Physical Therapy Information for After Breast Cancer Surgery/Treatment:  Lymphedema is a swelling condition that you may be at risk for in your arm if you have lymph nodes removed from the armpit area.  After a sentinel node biopsy, the risk is approximately 5-9% and is higher after an axillary node dissection.  There is treatment available for this condition and it is not life-threatening.  Contact your physician or physical therapist with concerns. You may begin the 4 shoulder/posture exercises (see additional sheet) when permitted by your physician (typically a week after surgery).  If you have drains, you may need to wait until those are removed before beginning range of motion exercises.  A general recommendation is to not lift your arms above shoulder height until drains are removed.  These exercises should be done to your tolerance and gently.  This is not a "no pain/no gain" type of recovery so listen to your body and stretch into the range of motion that you can tolerate, stopping if you have pain.  If you are having immediate reconstruction, ask your plastic surgeon about doing exercises as he or she may want you to wait. We encourage you to attend the free one time ABC (After Breast Cancer) class offered by [facility].  You will learn information related to lymphedema risk, prevention and treatment and additional exercises to  regain mobility following surgery.  You can call [phone] for more information.  This is offered the 1st and 3rd Monday of each month.  You only attend the class one time. While undergoing any medical procedure or treatment, try to avoid blood pressure being taken or needle sticks from occurring on the arm on the side of cancer.   This recommendation begins after surgery and continues for the rest of your life.  This may help reduce your risk of getting lymphedema (swelling in your arm). An excellent resource for those seeking information on lymphedema is the National Lymphedema Network's web site. It can be accessed at www.lymphnet.org If you notice swelling in your hand, arm or breast at any time following surgery (even if it is many years from now), please contact your doctor or physical therapist to discuss this.  Lymphedema can be treated at any time but it is easier for you if it is treated early on.  If you feel like your shoulder motion is not returning to normal in a reasonable amount of time, please contact your surgeon or physical therapist.  [facility] [phone]. [address], [place] [address]  ABC CLASS After Breast Cancer Class  After Breast Cancer  Class is a specially designed exercise class video to assist you in a safe recover after having breast cancer surgery.  In this video you will learn how to get back to full function whether your drains were just removed or if you had surgery a month ago. The video can be viewed on this page: [email or web address].boyd-meyer.org/ or on YouTube here: [email or web address].  Class Goals  Understand specific stretches to improve the flexibility of you chest and shoulder. Learn ways to safely strengthen your upper body and improve your posture. Understand the warning signs of infection and why you may be at risk for an arm  infection. Learn about Lymphedema and prevention.  ** You do not need to view this video until after surgery.  Drains should be removed to participate in the recommended exercises on the video.  Patient was instructed today in a home exercise program today for post op shoulder range of motion. These included active assist shoulder flexion in sitting, scapular retraction, wall walking with shoulder abduction, and hands behind head external rotation.  She was encouraged to do these twice a day, holding 3 seconds and repeating 5 times when permitted by her physician.  Rollin Clock, [place] 05/29/23 2:02 PM

## 2023-05-29 NOTE — Research (Signed)
 [employer] 2021-05 - Specimen Collection Study to Evaluate Biomarkers in Subjects with Cancer     Patient Ashlee Pena was identified by Dr. Arno Bibles as a potential candidate for the above listed study.  This Clinical Research Coordinator met with Ashlee Pena, [id number], on 05/29/23 in a manner and location that ensures patient privacy to discuss participation in the above listed research study.  Patient is Accompanied by Ashlee Pena .  A copy of the informed consent document with embedded HIPAA language was provided to the patient.  Patient reads, speaks, and understands [place].   Patient was provided with the business card of this Coordinator and encouraged to contact the research team with any questions.  Approximately 10 minutes were spent with the patient reviewing the informed consent documents.  Patient was provided the option of taking informed consent documents home to review and was encouraged to review at their convenience with their support network, including other care providers. Patient took the consent documents home to review. Will follow up patient next week about patient's interest for participation in this specimen study.   Ashlee Pena, MPH  Clinical Research Coordinator

## 2023-05-29 NOTE — Progress Notes (Signed)
 [facility] CONSULT NOTE  Patient Care Team: Vangie Genet, MD as PCP - General (Internal Medicine) Alane Hsu, RN as Oncology Nurse Navigator Auther Bo, RN as Oncology Nurse Navigator Enid Harry, MD as Consulting Physician (General Surgery) Murleen Arms, MD as Consulting Physician (Hematology and Oncology) Johna Myers, MD as Consulting Physician (Radiation Oncology)  CHIEF COMPLAINTS/PURPOSE OF CONSULTATION:  Newly diagnosed breast cancer  HISTORY OF PRESENTING ILLNESS:  Sina Sumpter 43 y.o. female is here because of recent diagnosis of left breast cancer  I reviewed her records extensively and collaborated the history with the patient.  SUMMARY OF ONCOLOGIC HISTORY: Oncology History  Malignant neoplasm of upper-outer quadrant of left breast in female, estrogen receptor positive (HCC)  05/15/2023 Mammogram   Suspicious 8 mm mass in the 12 o'clock region the left breast. Targeted ultrasound is performed, showing a hypoechoic mass with angular margins in the left breast at 12 o'clock 2 cm from the nipple measuring 7 x 7 x 8 mm. Sonographic evaluation of the left axilla does not show any enlarged adenopathy.   05/16/2023 Pathology Results   Left breast needle core biopsy at 12 0 clock 2 cmfn showed IDC grade 2, ER 90% pos strong staining intensity, PR 70% positive, moderate-strong staining intensity, Her 2 1+, Ki 67 1 %   05/27/2023 Initial Diagnosis   Malignant neoplasm of upper-outer quadrant of left breast in female, estrogen receptor positive (HCC)   05/29/2023 Cancer Staging   Staging form: Breast, AJCC 8th Edition - Clinical stage from 05/29/2023: Stage IA (cT1c, cN0, cM0, G2, ER+, PR+, HER2-) - Signed by Murleen Arms, MD on 05/29/2023 Stage prefix: Initial diagnosis Histologic grading system: 3 grade system    Discussed the use of AI scribe software for clinical note transcription with the patient, who gave verbal consent to  proceed.  History of Present Illness Aerie Donica is a 43 year old female with invasive ductal carcinoma of the left breast who presents for oncology consultation.  She has invasive ductal carcinoma of the left breast at the twelve o'clock position, measuring approximately 8x7x7 millimeters. The cancer is estrogen receptor-positive and progesterone receptor-positive, with a low proliferation index (KI-67 of 1%). The tumor is classified as grade 2, indicating intermediate aggressiveness. She is premenopausal and has not undergone menopause. Lymph nodes appeared normal on ultrasound.  She has a family history of breast cancer on her paternal side, with her father's aunt having had breast cancer. Her grandfather had melanoma. There is no known history of ovarian, pancreatic, or prostate cancer in the family.  She describes her lifestyle as high-stress due to self-employment and inconsistent income, which she believes contributes to her health issues. She has recently made dietary changes, eliminating sugar, meats, and proteins, and is working on managing stress better. She has no children and has used birth control, which she acknowledges as a risk factor for breast cancer.  She has no significant past medical history of heart problems, although her father died suddenly of undiagnosed heart disease at 37. No smoking or diabetes. She reports no significant symptoms prior to the biopsy, but notes pain in the breast since the procedure.   MEDICAL HISTORY:  Past Medical History:  Diagnosis Date   Breast cancer (HCC)    Chronic fatigue    Migraines     SURGICAL HISTORY: Past Surgical History:  Procedure Laterality Date   BREAST BIOPSY Left 05/16/2023   US  LT BREAST BX W LOC DEV 1ST LESION IMG BX SPEC US  GUIDE  05/16/2023 [facility] MAMMOGRAPHY   CLOSED REDUCTION CLAVICLE FRACTURE Right 2011    SOCIAL HISTORY: Social History   Socioeconomic History   Marital status: Single    Spouse name: Not  on file   Number of children: Not on file   Years of education: Not on file   Highest education level: Not on file  Occupational History   Not on file  Tobacco Use   Smoking status: Never   Smokeless tobacco: Never  Vaping Use   Vaping status: Never Used  Substance and Sexual Activity   Alcohol use: Yes    Alcohol/week: 7.0 standard drinks of alcohol    Types: 7 Standard drinks or equivalent per week   Drug use: No   Sexual activity: Yes    Partners: Male    Birth control/protection: Inserts  Other Topics Concern   Not on file  Social History Narrative   Not on file   Social Drivers of Health   Financial Resource Strain: Not on file  Food Insecurity: Not on file  Transportation Needs: Not on file  Physical Activity: Not on file  Stress: Not on file  Social Connections: Unknown (06/20/2021)   Received from [facility], [facility]   Social Network    Social Network: Not on file  Intimate Partner Violence: Unknown (05/08/2021)   Received from [facility], [facility]   HITS    Physically Hurt: Not on file    Insult or Talk Down To: Not on file    Threaten Physical Harm: Not on file    Scream or Curse: Not on file    FAMILY HISTORY: Family History  Problem Relation Age of Onset   Skin cancer Father    Heart disease Father 29       died   Chronic fatigue Father    Dementia Maternal Grandfather    Skin cancer Paternal Grandfather    Hypertension Paternal Grandfather    Stroke Paternal Grandfather    Heart attack Paternal Grandfather    Alzheimer's disease Paternal Great-grandfather     ALLERGIES:  is allergic to other.  MEDICATIONS:  Current Outpatient Medications  Medication Sig Dispense Refill   Ascorbic Acid (VITAMIN C) 1000 MG tablet Take 1,000 mg by mouth daily. (Patient not taking: Reported on 03/06/2023)     buPROPion  (WELLBUTRIN  XL) 150 MG 24 hr tablet TAKE 1 TABLET BY MOUTH EVERY DAY IN THE MORNING 90 tablet 4   Cholecalciferol (VITAMIN D3)  250 MCG (10000 UT) TABS Take by mouth daily. (Patient not taking: Reported on 03/06/2023)     Cyanocobalamin (B-12 PO) Take by mouth daily. (Patient not taking: Reported on 03/06/2023)     EPINEPHrine  0.3 mg/0.3 mL IJ SOAJ injection Inject 0.3 mg into the muscle as needed for anaphylaxis. (Patient not taking: Reported on 03/06/2023)     Ibuprofen 200 MG CAPS Take 200 mg by mouth as needed. (Patient not taking: Reported on 03/06/2023)     No current facility-administered medications for this visit.    REVIEW OF SYSTEMS:   Constitutional: Denies fevers, chills or abnormal night sweats Eyes: Denies blurriness of vision, double vision or watery eyes Ears, nose, mouth, throat, and face: Denies mucositis or sore throat Respiratory: Denies cough, dyspnea or wheezes Cardiovascular: Denies palpitation, chest discomfort or lower extremity swelling Gastrointestinal:  Denies nausea, heartburn or change in bowel habits Skin: Denies abnormal skin rashes Lymphatics: Denies new lymphadenopathy or easy bruising Neurological:Denies numbness, tingling or new weaknesses Behavioral/Psych: Mood is stable, no  new changes  Breast: Denies any palpable lumps or discharge All other systems were reviewed with the patient and are negative.  PHYSICAL EXAMINATION: ECOG PERFORMANCE STATUS: 0 - Asymptomatic  Vitals:   05/29/23 0854  BP: 114/79  Pulse: 67  Resp: 16  Temp: 98.1 F (36.7 C)  SpO2: 100%   Filed Weights   05/29/23 0854  Weight: 133 lb 9.6 oz (60.6 kg)    GENERAL:alert, no distress and comfortable BREAST:post biopsy changes left breast. No other palpable masses. No regional adenopathy  LABORATORY DATA:  I have reviewed the data as listed Lab Results  Component Value Date   WBC 6.6 03/06/2023   HGB 15.0 03/06/2023   HCT 44.2 03/06/2023   MCV 95.5 03/06/2023   PLT 190 03/06/2023   Lab Results  Component Value Date   NA 139 03/06/2023   K 4.1 03/06/2023   CL 101 03/06/2023   CO2 26  03/06/2023    RADIOGRAPHIC STUDIES: I have personally reviewed the radiological reports and agreed with the findings in the report.  ASSESSMENT AND PLAN:   Malignant neoplasm of upper-outer quadrant of left breast in female, estrogen receptor positive (HCC) This is a very pleasant 42 yr pre menopausal female patient with ER pos, PR pos, Her 2 neg BC referred to breast [facility] for additional recommendations. We have discussed the imaging results, reviewed pathology report and [facility] recommendations in detail. Given small tumor, favorable prognostics, we have reviewed lumpectomy first followed by oncotype DX results and additional anti estrogen therapy. We have discussed the following details about oncotype DX and anti estrogen therapy.  We have discussed about Oncotype Dx score which is a well validated prognostic scoring system which can predict outcome with endocrine therapy alone and whether chemotherapy reduces recurrence.  Typically in patients with ER positive cancers that are node negative if the RS score is high typically greater than or equal to 26, chemotherapy is recommended.  In women with intermediate recurrence score younger than 50, there can still be some role for chemotherapy in addition to endocrine therapy especially if the recurrence score is between 21-25. If chemotherapy is needed, this will precede radiation and then after radiation she will continue on antiestrogen therapy.  With regards to Tamoxifen, we discussed that this is a SERM, selective estrogen receptor modulator. We discussed mechanism of action of Tamoxifen, adverse effects on Tamoxifen including but not limited to post menopausal symptoms, increased risk of DVT/PE, increased risk of endometrial cancer, questionable cataracts with long term use and increased risk of cardiovascular events in the study which was not statistically significant. A benefit from Tamoxifen would be improvement in bone density.  All her questions  were answered to the best of knowledge.  She is debating lumpectomy vs mastectomy, recommended waiting for genetics to result as well. We have discussed that she may be a candidate for OFSET trial if her oncotype score is 16-25. I will see her after surgery to review oncotype and discuss any additional recommendations.  Murleen Arms MD      All questions were answered. The patient knows to call the clinic with any problems, questions or concerns.    Murleen Arms, MD 05/29/23

## 2023-05-29 NOTE — Assessment & Plan Note (Addendum)
 This is a very pleasant 42 yr pre menopausal female patient with ER pos, PR pos, Her 2 neg BC referred to breast [facility] for additional recommendations. We have discussed the imaging results, reviewed pathology report and [facility] recommendations in detail. Given small tumor, favorable prognostics, we have reviewed lumpectomy first followed by oncotype DX results and additional anti estrogen therapy. We have discussed the following details about oncotype DX and anti estrogen therapy.  We have discussed about Oncotype Dx score which is a well validated prognostic scoring system which can predict outcome with endocrine therapy alone and whether chemotherapy reduces recurrence.  Typically in patients with ER positive cancers that are node negative if the RS score is high typically greater than or equal to 26, chemotherapy is recommended.  In women with intermediate recurrence score younger than 50, there can still be some role for chemotherapy in addition to endocrine therapy especially if the recurrence score is between 21-25. If chemotherapy is needed, this will precede radiation and then after radiation she will continue on antiestrogen therapy.  With regards to Tamoxifen, we discussed that this is a SERM, selective estrogen receptor modulator. We discussed mechanism of action of Tamoxifen, adverse effects on Tamoxifen including but not limited to post menopausal symptoms, increased risk of DVT/PE, increased risk of endometrial cancer, questionable cataracts with long term use and increased risk of cardiovascular events in the study which was not statistically significant. A benefit from Tamoxifen would be improvement in bone density.  All her questions were answered to the best of knowledge.  She is debating lumpectomy vs mastectomy, recommended waiting for genetics to result as well. We have discussed that she may be a candidate for OFSET trial if her oncotype score is 16-25. I will see her after surgery to  review oncotype and discuss any additional recommendations.  Murleen Arms MD

## 2023-05-29 NOTE — Progress Notes (Signed)
 Radiation Oncology         [phone] ________________________________  Name: Ashlee Pena        MRN: [id number]  Date of Service: 05/29/2023 DOB: 10/01/1980  CC:Ashlee Genet, MD  Ashlee Harry, MD     REFERRING PHYSICIAN: Enid Harry, MD   DIAGNOSIS: The encounter diagnosis was Malignant neoplasm of upper-outer quadrant of left breast in female, estrogen receptor positive (HCC).  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 female seen in the multidisciplinary breast clinic for a new diagnosis of left breast cancer. The patient was noted to have asymmetry on screening mammogram. She proceeded with diagnostic mammogram and ultrasound on 05/15/2023 that showed 0.8 cm mass at the 12 o'clock position. No adenopathy was appreciated in the left axilla.. Accordingly, patient underwent a biopsy on 05/16/2023 that revealed grade 2 invasive ductal carcinoma that was ER and PR positive and HER2 negative with a Ki-67 1%.   She is seen today to discuss treatment recommendations of her cancer.      PREVIOUS RADIATION THERAPY: No   PAST MEDICAL HISTORY:  Past Medical History:  Diagnosis Date   Breast cancer (HCC)    Chronic fatigue    Migraines        PAST SURGICAL HISTORY: Past Surgical History:  Procedure Laterality Date   BREAST BIOPSY Left 05/16/2023   US  LT BREAST BX W LOC DEV 1ST LESION IMG BX SPEC US  GUIDE 05/16/2023 [facility]AMMOGRAP[facility]   CLOSED REDUCTION CLAVICLE FRACTURE Right 2011     FAMILY HISTORY:  Family History  Problem Relation Age of Onset   Skin cancer Father    Heart disease Father 21       died   Chronic fatigue Father    Dementia Maternal Grandfather    Skin cancer Paternal Grandfather    Hypertension Paternal Grandfather    Stroke Paternal Grandfather    Heart attack Paternal Grandfather    Alzheimer's disease Paternal  Great-grandfather      SOCIAL HISTORY:  reports that she has never smoked. She has never used smokeless tobacco. She reports current alcohol use of about 7.0 standard drinks of alcohol per week. She reports that she does not use drugs. She works as a Engineer, agricultural.    ALLERGIES: Other   MEDICATIONS:  Current Outpatient Medications  Medication Sig Dispense Refill   Ascorbic Acid (VITAMIN C) 1000 MG tablet Take 1,000 mg by mouth daily. (Patient not taking: Reported on 03/06/2023)     buPROPion  (WELLBUTRIN  XL) 150 MG 24 hr tablet TAKE 1 TABLET BY MOUTH EVERY DAY IN THE MORNING 90 tablet 4   Cholecalciferol (VITAMIN D3) 250 MCG (10000 UT) TABS Take by mouth daily. (Patient not taking: Reported on 03/06/2023)     Cyanocobalamin (B-12 PO) Take by mouth daily. (Patient not taking: Reported on 03/06/2023)     EPINEPHrine  0.3 mg/0.3 mL IJ SOAJ injection Inject 0.3 mg into the muscle as needed for anaphylaxis. (Patient not taking: Reported on 03/06/2023)     Ibuprofen 200 MG CAPS Take 200 mg by mouth as needed. (Patient not taking: Reported on 03/06/2023)     No current facility-administered medications for this encounter.     REVIEW OF SYSTEMS: On review of systems, the patient reports that she is doing well overall. No breast specific complaints are verbalized.        PHYSICAL EXAM:  Wt Readings from Last 3 Encounters:  05/29/23 133 lb 9.6 oz (60.6 kg)  03/06/23 132 lb 12.8 oz (60.2 kg)  10/03/21 132 lb (59.9 kg)   Temp Readings from Last 3 Encounters:  05/29/23 98.1 F (36.7 C) (Temporal)  03/06/23 97.8 F (36.6 C)  10/03/21 97.8 F (36.6 C)   BP Readings from Last 3 Encounters:  05/29/23 114/79  03/06/23 108/70  10/03/21 100/60   Pulse Readings from Last 3 Encounters:  05/29/23 67  03/06/23 80  10/03/21 83    In general this is a well appearing  female in no acute distress. She's alert and oriented x4 and appropriate throughout the examination. Cardiopulmonary  assessment is negative for acute distress and she exhibits normal effort. Bilateral breast exam is deferred.    ECOG = 0  0 - Asymptomatic (Fully active, able to carry on all predisease activities without restriction)  1 - Symptomatic but completely ambulatory (Restricted in physically strenuous activity but ambulatory and able to carry out work of a light or sedentary nature. For example, light housework, office work)  2 - Symptomatic, <50% in bed during the day (Ambulatory and capable of all self care but unable to carry out any work activities. Up and about more than 50% of waking hours)  3 - Symptomatic, >50% in bed, but not bedbound (Capable of only limited self-care, confined to bed or chair 50% or more of waking hours)  4 - Bedbound (Completely disabled. Cannot carry on any self-care. Totally confined to bed or chair)  5 - Death   Aurea Blossom MM, Creech RH, Tormey DC, et al. [phone]). "Toxicity and response criteria of the [facility]". Am. Hillard Lowes. Oncol. 5 (6): 649-55    LABORATORY DATA:  Lab Results  Component Value Date   WBC 6.6 03/06/2023   HGB 15.0 03/06/2023   HCT 44.2 03/06/2023   MCV 95.5 03/06/2023   PLT 190 03/06/2023   Lab Results  Component Value Date   NA 139 03/06/2023   K 4.1 03/06/2023   CL 101 03/06/2023   CO2 26 03/06/2023   Lab Results  Component Value Date   ALT 11 03/06/2023   AST 14 03/06/2023   ALKPHOS 44 01/04/2016   BILITOT 0.6 03/06/2023      RADIOGRAPHY: US  LT BREAST BX W LOC DEV 1ST LESION IMG BX SPEC US  GUIDE Addendum Date: 05/17/2023 ADDENDUM REPORT: 05/17/2023 15:36 ADDENDUM: Pathology revealed GRADE II INVASIVE DUCTAL CARCINOMA, DUCTAL CARCINOMA IN SITU, SOLID, INTERMEDIATE NUCLEAR GRADE of the LEFT breast, 12 o'clock, 2cmfn, (coil clip). This was found to be concordant by Dr. Roda Cirri. Pathology results were discussed with the patient by telephone. The patient reported doing well after the biopsy with  tenderness at the site. Post biopsy instructions and care were reviewed and questions were answered. The patient was encouraged to call [facility] for any additional concerns. My direct phone number was provided. The patient was referred to [facility] on May 29, 2023. Recommendation for a bilateral breast MRI given age and breast density-D. Pathology results reported by Kraig Peru, RN on 05/17/2023. Electronically Signed   By: Roda Cirri M.D.   On: 05/17/2023 15:36   Result Date: 05/17/2023 CLINICAL DATA:  Patient presents for ultrasound-guided core needle biopsy of an 8 mm suspicious mass over the 12 o'clock position of the left breast 2 cm from the nipple. EXAM: ULTRASOUND GUIDED LEFT  BREAST CORE NEEDLE BIOPSY COMPARISON:  Previous exam(s). PROCEDURE: I met with the patient and we discussed the procedure of ultrasound-guided biopsy, including benefits and alternatives. We discussed the high likelihood of a successful procedure. We discussed the risks of the procedure, including infection, bleeding, tissue injury, clip migration, and inadequate sampling. Informed written consent was given. The usual time-out protocol was performed immediately prior to the procedure. Lesion quadrant: Left upper outer quadrant (12-12:30 position). Using sterile technique and 1% Lidocaine as local anesthetic, under direct ultrasound visualization, a 12 gauge spring-loaded device was used to perform biopsy of the targeted 8 mm mass over the 12 o'clock position of the left breast 2 cm from the nipple using a lateral to medial approach. At the conclusion of the procedure a coil shaped tissue marker clip was deployed into the biopsy cavity. Follow up 2 view mammogram was performed and dictated separately. IMPRESSION: Ultrasound guided biopsy of a suspicious left breast mass. No apparent complications. Electronically Signed:  By: Roda Cirri M.D. On: 05/16/2023 14:55   MM CLIP PLACEMENT LEFT Result Date: 05/16/2023 CLINICAL DATA:  Patient is post ultrasound-guided core needle biopsy of an irregular 8 mm mass over the 12 o'clock position of the left breast 2 cm from the nipple. EXAM: 3D DIAGNOSTIC LEFT MAMMOGRAM POST ULTRASOUND BIOPSY COMPARISON:  Previous exam(s). ACR Breast Density Category d: The breasts are extremely dense, which lowers the sensitivity of mammography. FINDINGS: 3D Mammographic images were obtained following ultrasound guided biopsy of the targeted mass at the 12 o'clock position 2 cm from the nipple. The biopsy marking clip is in expected position at the site of biopsy. IMPRESSION: Appropriate positioning of the coil shaped biopsy marking clip at the site of biopsy in the 12 o'clock position of the left periareolar region. Final Assessment: Post Procedure Mammograms for Marker Placement Electronically Signed   By: Roda Cirri M.D.   On: 05/16/2023 15:07   MM 3D DIAGNOSTIC MAMMOGRAM UNILATERAL LEFT BREAST Result Date: 05/15/2023 CLINICAL DATA:  Patient was recalled from screening mammogram for a possible asymmetry and distortion in the left breast. EXAM: DIGITAL DIAGNOSTIC UNILATERAL LEFT MAMMOGRAM WITH TOMOSYNTHESIS AND CAD; ULTRASOUND LEFT BREAST LIMITED TECHNIQUE: Left digital diagnostic mammography and breast tomosynthesis was performed. The images were evaluated with computer-aided detection. ; Targeted ultrasound examination of the left breast was performed. COMPARISON:  Previous exam(s). ACR Breast Density Category d: The breasts are extremely dense, which lowers the sensitivity of mammography. FINDINGS: Additional imaging of the left breast was performed. There is persistence of a mass with spiculated margins and associated distortion in the 12 o'clock region of the breast. On physical exam, I do not palpate a discrete mass in the 12 o'clock region of the left breast. Targeted ultrasound is performed,  showing a hypoechoic mass with angular margins in the left breast at 12 o'clock 2 cm from the nipple measuring 7 x 7 x 8 mm. Sonographic evaluation of the left axilla does not show any enlarged adenopathy. IMPRESSION: Suspicious 8 mm mass in the 12 o'clock region the left breast. RECOMMENDATION: Ultrasound-guided core biopsy of the mass in the 12 o'clock region of the left breast is recommended. Patient is scheduled for the breast biopsy on 05/16/2023. I have discussed the findings and recommendations with the patient. If applicable, a reminder letter will be sent to the patient regarding the next appointment. BI-RADS CATEGORY  5: Highly suggestive of malignancy. Electronically Signed   By: Dina  Arceo M.D.   On: 05/15/2023 15:52  US  LIMITED ULTRASOUND INCLUDING AXILLA LEFT BREAST  Result Date: 05/15/2023 CLINICAL DATA:  Patient was recalled from screening mammogram for a possible asymmetry and distortion in the left breast. EXAM: DIGITAL DIAGNOSTIC UNILATERAL LEFT MAMMOGRAM WITH TOMOSYNTHESIS AND CAD; ULTRASOUND LEFT BREAST LIMITED TECHNIQUE: Left digital diagnostic mammography and breast tomosynthesis was performed. The images were evaluated with computer-aided detection. ; Targeted ultrasound examination of the left breast was performed. COMPARISON:  Previous exam(s). ACR Breast Density Category d: The breasts are extremely dense, which lowers the sensitivity of mammography. FINDINGS: Additional imaging of the left breast was performed. There is persistence of a mass with spiculated margins and associated distortion in the 12 o'clock region of the breast. On physical exam, I do not palpate a discrete mass in the 12 o'clock region of the left breast. Targeted ultrasound is performed, showing a hypoechoic mass with angular margins in the left breast at 12 o'clock 2 cm from the nipple measuring 7 x 7 x 8 mm. Sonographic evaluation of the left axilla does not show any enlarged adenopathy. IMPRESSION: Suspicious 8  mm mass in the 12 o'clock region the left breast. RECOMMENDATION: Ultrasound-guided core biopsy of the mass in the 12 o'clock region of the left breast is recommended. Patient is scheduled for the breast biopsy on 05/16/2023. I have discussed the findings and recommendations with the patient. If applicable, a reminder letter will be sent to the patient regarding the next appointment. BI-RADS CATEGORY  5: Highly suggestive of malignancy. Electronically Signed   By: Dina  Arceo M.D.   On: 05/15/2023 15:52       IMPRESSION/PLAN: 1. 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 Jeryl Moris discussed the pathology findings and reviewed the nature of early stage breast cancer. The consensus from the breast conference includes lumpectomy with sentinel lymph node sampling, Oncotype testing adjuvant radiation, and antiestrogens. Dr. Jeryl Moris recommends external beam radiotherapy to the breast following her lumpectomy to reduce risks of local recurrence.  She understands that if chemotherapy is indicated, this would precede her radiation treatment. We discussed the risks, benefits, short, and long term effects of radiotherapy, as well as the curative intent, and the patient is interested in proceeding. Dr. Jeryl Moris discussed the delivery and logistics of radiotherapy and anticipates a course of 4-6.5 weeks of radiotherapy to the left breast with deep inspiration breath hold technique. We discussed the traditional 6.5-week course of treatment and the newer hypofractionated 4-week course. While the hypofractionated treatment has less long-term data, we still offer the 6.5-week course for younger patients. We will see her back a few weeks after surgery to discuss the simulation process and anticipate starting radiotherapy about 4-6 weeks after surgery.   2. Possible genetic predisposition to malignancy. The patient is a candidate for genetic testing given her personal and  family history. She will meet with our geneticist today in clinic.   In a visit lasting 60 minutes, greater than 50% of the time was spent face to face reviewing her case, as well as in preparation of, discussing, and coordinating the patient's care.  The above documentation reflects my direct findings during this shared patient visit. Please see the separate note by Dr. Jeryl Moris on this date for the remainder of the patient's plan of care.    Amiel Kalata, [place]    **Disclaimer: This note was dictated with voice recognition software. Similar sounding words can inadvertently be transcribed and this note may contain transcription errors which may  not have been corrected upon publication of note.**

## 2023-05-29 NOTE — Progress Notes (Signed)
 REFERRING PROVIDER: Murleen Arms, MD [address] [place],  [place] [address]   PRIMARY PROVIDER:  Vangie Genet, MD  PRIMARY REASON FOR VISIT:  1. Malignant neoplasm of upper-outer quadrant of left breast in female, estrogen receptor positive (HCC)   2. Family history of breast cancer      HISTORY OF PRESENT ILLNESS:   Ashlee Pena, a 43 y.o. female, was seen for a [facility] cancer genetics consultation during the breast multidisciplinary clinic at the request of Dr. Arno Bibles due to a personal history of breast cancer.  Ashlee Pena presents to clinic today to discuss the possibility of a hereditary predisposition to cancer, to discuss genetic testing, and to further clarify her future cancer risks, as well as potential cancer risks for family members.   In April 2025, at the age of 27, Ashlee Pena was diagnosed with invasive ductal carcinoma of the left breast (ER+/PR+/HER2-). The treatment plan is pending.  She is deciding between breast conserving surgery and bilateral mastectomy.    CANCER HISTORY:  Oncology History  Malignant neoplasm of upper-outer quadrant of left breast in female, estrogen receptor positive (HCC)  05/15/2023 Mammogram   Suspicious 8 mm mass in the 12 o'clock region the left breast. Targeted ultrasound is performed, showing a hypoechoic mass with angular margins in the left breast at 12 o'clock 2 cm from the nipple measuring 7 x 7 x 8 mm. Sonographic evaluation of the left axilla does not Pena any enlarged adenopathy.   05/16/2023 Pathology Results   Left breast needle core biopsy at 12 0 clock 2 cmfn showed IDC grade 2, ER 90% pos strong staining intensity, PR 70% positive, moderate-strong staining intensity, Her 2 1+, Ki 67 1 %   05/27/2023 Initial Diagnosis   Malignant neoplasm of upper-outer quadrant of left breast in female, estrogen receptor positive (HCC)   05/29/2023 Cancer Staging   Staging form: Breast, AJCC 8th Edition - Clinical stage from  05/29/2023: Stage IA (cT1c, cN0, cM0, G2, ER+, PR+, HER2-) - Signed by Murleen Arms, MD on 05/29/2023 Stage prefix: Initial diagnosis Histologic grading system: 3 grade system      Past Medical History:  Diagnosis Date   Breast cancer (HCC)    Chronic fatigue    Migraines     Past Surgical History:  Procedure Laterality Date   BREAST BIOPSY Left 05/16/2023   US  LT BREAST BX W LOC DEV 1ST LESION IMG BX SPEC US  GUIDE 05/16/2023 [facility] MAMMOGRAPHY   CLOSED REDUCTION CLAVICLE FRACTURE Right 2011     FAMILY HISTORY:  We obtained a detailed, 4-generation family history.  Significant diagnoses are listed below: Family History  Problem Relation Age of Onset   Stomach cancer Maternal Uncle        dx >50   Skin cancer Paternal Grandfather        melanoma   Breast cancer Other 12       PGF's sister     Ashlee Pena is unaware of previous family history of genetic testing for hereditary cancer risks. There is no reported Ashkenazi Jewish ancestry. There is no known consanguinity.  GENETIC COUNSELING ASSESSMENT: Ashlee Pena is a 43 y.o. female with a personal history of breast cancer which is somewhat suggestive of a hereditary cancer syndrome and predisposition to cancer given her age of diagnosis. We, therefore, discussed and recommended the following at today's visit.   DISCUSSION: We discussed that 5 - 10% of cancer is hereditary, with most cases of hereditary breast cancer associated  with mutations in BRCA1/2.  There are other genes that can be associated with hereditary breast cancer syndromes.  Type of cancer risk and level of risk are gene-specific. We discussed that testing is beneficial for several reasons including knowing how to follow individuals after completing their treatment, identifying whether potential/surgery treatment options would be beneficial, and understanding if other family members could be at risk for cancer and allowing them to undergo genetic testing.   We  reviewed the characteristics, features and inheritance patterns of hereditary cancer syndromes. We also discussed genetic testing, including the appropriate family members to test, the process of testing, insurance coverage and turn-around-time for results. We discussed the implications of a negative, positive and/or variant of uncertain significant result. In order to get genetic test results in a timely manner so that Ashlee Pena can use these genetic test results for surgical decisions, we recommended Ashlee Pena pursue genetic testing for the [facility] BRCAPlus Panel.  The BRCAPlus gene panel offered by [facility] and includes sequencing and rearrangement analysis for the following 13 genes: ATM, BARD1, BRCA1, BRCA2, CDH1, CHEK2, NF1, PALB2, PTEN, RAD51C, RAD51D, STK11 and TP53. Once complete, we recommend Ashlee Pena pursue reflex genetic testing to a more comprehensive gene panel.   Ashlee Pena  was offered a common hereditary cancer panel (~40 genes) and an expanded pan-cancer panel (~70 genes). Ashlee Pena was informed of the benefits and limitations of each panel, including that expanded pan-cancer panels contain genes that do not have clear management guidelines at this point in time.  We also discussed that as the number of genes included on a panel increases, the chances of variants of uncertain significance increases.  After considering the benefits and limitations of each gene panel, Ashlee Pena  elected to have a common hereditary cancers panel through [employer] [facility] [employer]  The [facility] CustomNext-Cancer +RNAinsight Panel (CancerNext + melanoma genes) includes sequencing, deletion/duplication, and RNA analysis for the following 44 genes: APC, ATM, AXIN2, BAP1, BARD1, BMPR1A, BRCA1, BRCA2, BRIP1, CDH1, CDK4, CDKN2A, CHEK2, EPCAM, FH, FLCN, GREM1, HOXB13, MBD4, MET, MITF, MLH1, MSH2, MSH3, MSH6, MUTYH, NF1, NTHL1, PALB2, PMS2, POLD1, POLE, POT1, PTEN, RAD51C, RAD51D, RB1, RPS20, SMAD4, STK11,  TP53, TSC1, TSC2, VHL  Based on Ashlee Pena personal history of breast cancer at age 24, she meets NCCNmedical criteria for genetic testing. Despite that she meets criteria, she may still have an out of pocket cost. We discussed that if her out of pocket cost for testing is over $100, the laboratory should contact them to discuss self-pay prices, patient pay assistance programs, if applicable, and other billing options.   PLAN: After considering the risks, benefits, and limitations, Ashlee Pena provided informed consent to pursue genetic testing and the blood sample was sent to [facility] for analysis of the BRCAPlus and CustomNext-Cancer +RNAinsight Panel. Results should be available within approximately 1-2 weeks' time, at which point they will be disclosed by telephone to Ashlee Pena, as will any additional recommendations warranted by these results. Ashlee Pena will receive a summary of her genetic counseling visit and a copy of her results once available. This information will also be available in Epic.   Ashlee Pena questions were answered to her satisfaction today. Our contact information was provided should additional questions or concerns arise. Thank you for the referral and allowing us  to share in the care of your patient.   Ashlee Fazzino M. Ora Billing, MS, [facility] Genetic Counselor [email or web address] [phone]) [phone]    40 minutes were spent on the date of  the encounter in service to the patient including preparation, face-to-face consultation, documentation and care coordination.  The patient was accompanied by her partner.  Dr. Arno Bibles was available to discuss this case as needed.   _______________________________________________________________________ For Office Staff:  Number of people involved in session: 2 Was an Intern/ student involved with case: no

## 2023-05-31 NOTE — Addendum Note (Signed)
 Encounter addended by: Johna Myers, MD on: 05/31/2023 12:09 PM  Actions taken: Clinical Note Signed

## 2023-06-03 ENCOUNTER — Inpatient Hospital Stay: Admitting: Licensed Clinical Social Worker

## 2023-06-03 DIAGNOSIS — C50412 Malignant neoplasm of upper-outer quadrant of left female breast: Secondary | ICD-10-CM

## 2023-06-03 NOTE — Progress Notes (Signed)
 [facility] CSW Counseling Note  Patient was referred by self. Treatment type: Individual  Presenting Concerns: Patient and/or family reports the following symptoms/concerns: anxiety Duration of problem: 1 months; Severity of problem: moderate   Orientation:oriented to person, place, time/date, and situation.   Affect: Appropriate and Congruent Risk of harm to self or others: No plan to harm self or others  Patient and/or Family's Strengths/Protective Factors: Social connectionsAbility for insight  Capable of independent living  Motivation for treatment/growth  Supportive family/friends      Goals Addressed: Patient will:  Reduce symptoms of: anxiety and stress Increase knowledge and/or ability of: coping skills  Increase healthy adjustment to current life circumstances   Progress towards Goals: Initial   Interventions: Interventions utilized:  Solution Focused and Other: psychoeducation       Assessment: Patient currently experiencing anxiety and high stress levels around cancer diagnosis, adjusting to and processing diagnosis and proposed treatment plans. Pt is grappling with proposed treatment plan as well as wanting to do more holistic routes as well. Pt shared about her stressors at home and with work and concern that stress may have been a factor in her cancer.  She did share that she feels better than last week pre-BMDC.  CSW normalized having stress with diagnosis regardless of prognosis.   Pt is also stressed with how many phone calls, texts, and drop-in visits she is receiving from well-meaning friends and acquaintances. Discussed today ways to determine what would be most helpful and least stressful for support and how to communicate that (ex: Do not disturb sign on door; [facility] [place]; point people, etc).   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  It is okay to set boundaries and utilize "do not disturb" time Referral(s): n/a       Rayman Petrosian E Snyder Colavito, LCSW

## 2023-06-03 NOTE — Research (Signed)
 [employer] 2021-05 - Specimen Collection Study to Evaluate Biomarkers in Subjects with Cancer    Called patient via telephone about interest in participating in exact sciences. Patient refused ; did not want to come in to give blood.   Pinkey Brier, MPH  Clinical Research Coordinator

## 2023-06-04 ENCOUNTER — Telehealth: Payer: Self-pay | Admitting: *Deleted

## 2023-06-04 NOTE — Telephone Encounter (Signed)
 Spoke to pt concerning BMDC from 05/29/23. Denies questions or concerns regarding dx or treatment care plan. Encourage pt to call with needs. Received verbal understanding.  Confirmed CEM for 06/05/23.

## 2023-06-05 ENCOUNTER — Other Ambulatory Visit: Payer: Self-pay | Admitting: General Surgery

## 2023-06-05 ENCOUNTER — Ambulatory Visit
Admission: RE | Admit: 2023-06-05 | Discharge: 2023-06-05 | Disposition: A | Discharge status: Home / Self Care | Source: Ambulatory Visit | Attending: General Surgery | Admitting: General Surgery

## 2023-06-05 ENCOUNTER — Encounter: Payer: Self-pay | Admitting: *Deleted

## 2023-06-05 DIAGNOSIS — D0512 Intraductal carcinoma in situ of left breast: Secondary | ICD-10-CM | POA: Diagnosis not present

## 2023-06-05 DIAGNOSIS — C50412 Malignant neoplasm of upper-outer quadrant of left female breast: Secondary | ICD-10-CM

## 2023-06-05 DIAGNOSIS — R928 Other abnormal and inconclusive findings on diagnostic imaging of breast: Secondary | ICD-10-CM | POA: Diagnosis not present

## 2023-06-05 DIAGNOSIS — N6489 Other specified disorders of breast: Secondary | ICD-10-CM

## 2023-06-05 DIAGNOSIS — D242 Benign neoplasm of left breast: Secondary | ICD-10-CM | POA: Diagnosis not present

## 2023-06-05 DIAGNOSIS — N6312 Unspecified lump in the right breast, upper inner quadrant: Secondary | ICD-10-CM | POA: Diagnosis not present

## 2023-06-05 DIAGNOSIS — N6310 Unspecified lump in the right breast, unspecified quadrant: Secondary | ICD-10-CM

## 2023-06-05 MED ORDER — IOPAMIDOL (ISOVUE-370) INJECTION 76%
100.0000 mL | Freq: Once | INTRAVENOUS | Status: AC | PRN
  Administered 2023-06-05: 100 mL via INTRAVENOUS

## 2023-06-10 ENCOUNTER — Encounter: Payer: Self-pay | Admitting: Genetic Counselor

## 2023-06-10 ENCOUNTER — Telehealth: Payer: Self-pay | Admitting: Genetic Counselor

## 2023-06-10 DIAGNOSIS — Z1379 Encounter for other screening for genetic and chromosomal anomalies: Secondary | ICD-10-CM | POA: Insufficient documentation

## 2023-06-10 NOTE — Telephone Encounter (Signed)
 Disclosed negative STAT results. Pan-cancer panel is pending

## 2023-06-11 ENCOUNTER — Ambulatory Visit
Admission: RE | Admit: 2023-06-11 | Discharge: 2023-06-11 | Disposition: A | Discharge status: Home / Self Care | Source: Ambulatory Visit | Attending: General Surgery | Admitting: General Surgery

## 2023-06-11 DIAGNOSIS — N6312 Unspecified lump in the right breast, upper inner quadrant: Secondary | ICD-10-CM | POA: Diagnosis not present

## 2023-06-11 DIAGNOSIS — R92341 Mammographic extreme density, right breast: Secondary | ICD-10-CM | POA: Diagnosis not present

## 2023-06-11 DIAGNOSIS — N6031 Fibrosclerosis of right breast: Secondary | ICD-10-CM | POA: Diagnosis not present

## 2023-06-11 DIAGNOSIS — N6310 Unspecified lump in the right breast, unspecified quadrant: Secondary | ICD-10-CM

## 2023-06-11 HISTORY — PX: BREAST BIOPSY: SHX20

## 2023-06-12 LAB — SURGICAL PATHOLOGY

## 2023-06-14 ENCOUNTER — Other Ambulatory Visit: Payer: Self-pay | Admitting: General Surgery

## 2023-06-14 DIAGNOSIS — C50412 Malignant neoplasm of upper-outer quadrant of left female breast: Secondary | ICD-10-CM

## 2023-06-19 ENCOUNTER — Encounter: Payer: Self-pay | Admitting: Genetic Counselor

## 2023-06-19 ENCOUNTER — Encounter: Payer: Self-pay | Admitting: *Deleted

## 2023-06-19 ENCOUNTER — Ambulatory Visit: Payer: Self-pay | Admitting: Genetic Counselor

## 2023-06-19 DIAGNOSIS — Z1379 Encounter for other screening for genetic and chromosomal anomalies: Secondary | ICD-10-CM

## 2023-06-19 NOTE — Telephone Encounter (Signed)
 Disclosed negative genetics Optician, dispensing)

## 2023-06-19 NOTE — Progress Notes (Signed)
 HPI:   Ms. Cull was previously seen in the [facility] due to a personal and family history of cancer and concerns regarding a hereditary predisposition to cancer. Please refer to our prior cancer genetics clinic note for more information regarding our discussion, assessment and recommendations, at the time. Ms. Laporta recent genetic test results were disclosed to her by her genetic counselor, Kerin Pebbles, as were recommendations warranted by these results. These results and recommendations are discussed in more detail below.  CANCER HISTORY:  Oncology History  Malignant neoplasm of upper-outer quadrant of left breast in female, estrogen receptor positive (HCC)  05/15/2023 Mammogram   Suspicious 8 mm mass in the 12 o'clock region the left breast. Targeted ultrasound is performed, showing a hypoechoic mass with angular margins in the left breast at 12 o'clock 2 cm from the nipple measuring 7 x 7 x 8 mm. Sonographic evaluation of the left axilla does not show any enlarged adenopathy.   05/16/2023 Pathology Results   Left breast needle core biopsy at 12 0 clock 2 cmfn showed IDC grade 2, ER 90% pos strong staining intensity, PR 70% positive, moderate-strong staining intensity, Her 2 1+, Ki 67 1 %   05/27/2023 Initial Diagnosis   Malignant neoplasm of upper-outer quadrant of left breast in female, estrogen receptor positive (HCC)   05/29/2023 Cancer Staging   Staging form: Breast, AJCC 8th Edition - Clinical stage from 05/29/2023: Stage IA (cT1c, cN0, cM0, G2, ER+, PR+, HER2-) - Signed by Murleen Arms, MD on 05/29/2023 Stage prefix: Initial diagnosis Histologic grading system: 3 grade system   06/10/2023 Genetic Testing   Negative [facility] CustomNext-Cancer +RNAinsight Panel.  Report date is 06/10/2023.   The [facility] CustomNext-Cancer +RNAinsight Panel (CancerNext + melanoma genes) includes sequencing, deletion/duplication, and RNA analysis for the following 44 genes:  APC,  ATM, BAP1, BARD1, BMPR1A, BRCA1, BRCA2, BRIP1, CDH1, CDK4, CDKN2A, CHEK2, FH, FLCN, MET, MLH1, MSH2, MSH6, MUTYH, NF1, NTHL1, PALB2, PMS2, POT1, PTEN, RAD51C, RAD51D, RB1, RPS20, SMAD4, STK11, TP53, TSC1, TSC2 and VHL (sequencing and deletion/duplication); AXIN2, HOXB13, MBD4, MITF, MSH3, POLD1 and POLE (sequencing only); EPCAM and GREM1 (deletion/duplication only).      FAMILY HISTORY:  We obtained a detailed, 4-generation family history.  Significant diagnoses are listed below:      Family History  Problem Relation Age of Onset   Stomach cancer Maternal Uncle          dx >50   Skin cancer Paternal Grandfather          melanoma   Breast cancer Other 22        PGF's sister       Ms. Linnemann is unaware of previous family history of genetic testing for hereditary cancer risks. There is no reported Ashkenazi Jewish ancestry. There is no known consanguinity.  GENETIC TEST RESULTS:  The [facility] CustomNext Panel found no pathogenic mutations.   The [facility] CustomNext-Cancer +RNAinsight Panel (CancerNext + melanoma genes) includes sequencing, deletion/duplication, and RNA analysis for the following 44 genes: APC, ATM, AXIN2, BAP1, BARD1, BMPR1A, BRCA1, BRCA2, BRIP1, CDH1, CDK4, CDKN2A, CHEK2, EPCAM, FH, FLCN, GREM1, HOXB13, MBD4, MET, MITF, MLH1, MSH2, MSH3, MSH6, MUTYH, NF1, NTHL1, PALB2, PMS2, POLD1, POLE, POT1, PTEN, RAD51C, RAD51D, RB1, RPS20, SMAD4, STK11, TP53, TSC1, TSC2, VHL.  The test report has been scanned into EPIC and is located under the Molecular Pathology section of the Results Review tab.  A portion of the result report is included below for reference. Genetic testing reported out  on 06/10/2023.       Even though a pathogenic variant was not identified, possible explanations for her personal history of cancer may include: There may be no hereditary risk for cancer in the family. The cancers in Ms. Depierro and/or her family may be due to other genetic or environmental  factors. There may be a gene mutation in one of these genes that current testing methods cannot detect, but that chance is small. There could be another gene that has not yet been discovered, or that we have not yet tested, that is responsible for the cancer diagnoses in the family.   Therefore, it is important to remain in touch with cancer genetics in the future so that we can continue to offer Ms. Dicesare the most up to date genetic testing.   ADDITIONAL GENETIC TESTING:  We discussed with Ms. Petruska that her genetic testing was fairly extensive.  If there are genes identified to increase cancer risk that can be analyzed in the future, we would be happy to discuss and coordinate this testing at that time.    CANCER SCREENING RECOMMENDATIONS:  Ms. Glasner test result is considered negative (normal).  This means that we have not identified a hereditary cause for her personal and family history of cancer at this time.   An individual's cancer risk and medical management are not determined by genetic test results alone. Overall cancer risk assessment incorporates additional factors, including personal medical history, family history, and any available genetic information that may result in a personalized plan for cancer prevention and surveillance. Therefore, it is recommended she continue to follow the cancer management and screening guidelines provided by her oncology and primary healthcare provider.  RECOMMENDATIONS FOR FAMILY MEMBERS:   Individuals in this family might be at some increased risk of developing cancer, over the general population risk, due to the family history of cancer. We recommend women in this family have a yearly mammogram beginning at age 27, or 80 years younger than the earliest onset of cancer, an annual clinical breast exam, and perform monthly breast self-exams.  FOLLOW-UP:  Cancer genetics is a rapidly advancing field and it is possible that new genetic tests  will be appropriate for her and/or her family members in the future. We encouraged her to remain in contact with cancer genetics on an annual basis so we can update her personal and family histories and let her know of advances in cancer genetics that may benefit this family.   Our contact number was provided. Ms. Ihrig questions were answered to her satisfaction, and she knows she is welcome to call us  at anytime with additional questions or concerns.   Elna Radovich, MS, [facility] Genetic Counselor [email or web address] (P) [phone]

## 2023-06-21 ENCOUNTER — Other Ambulatory Visit: Payer: Self-pay | Admitting: General Surgery

## 2023-06-21 DIAGNOSIS — C50412 Malignant neoplasm of upper-outer quadrant of left female breast: Secondary | ICD-10-CM

## 2023-08-02 ENCOUNTER — Encounter (HOSPITAL_BASED_OUTPATIENT_CLINIC_OR_DEPARTMENT_OTHER): Payer: Self-pay | Admitting: General Surgery

## 2023-08-02 ENCOUNTER — Other Ambulatory Visit: Payer: Self-pay

## 2023-08-02 NOTE — Progress Notes (Signed)
   08/02/23 1035  Pre-op Phone Call  Surgery Date Verified 08/12/23  Arrival Time Verified 0600  Surgery Location Verified [facility]  Medical History Reviewed Yes  Is the patient taking a GLP-1 receptor agonist? No  Does the patient have diabetes? No diagnosis of diabetes  Do you have a history of heart problems? No  Antiarrhythmic device type  (NA)  Does patient have other implanted devices? No  Patient Teaching Enhanced Recovery;Pre / Post Procedure;Pre-op CHG Bathing  Patient educated about smoking cessation 24 hours prior to surgery. N/A Non-Smoker  Patient verbalizes understanding of bowel prep? N/A  Med Rec Completed Yes  Take the Following Meds the Morning of Surgery no meds DOS; hold nsaid/herb/supp x5d  Recent  Lab Work, EKG, CXR? No  NPO (Including gum & candy) After midnight  Patient instructed to stop clear liquids including Carb loading drink at: 0400  Stop Solids, Milk, Candy, and Gum STARTING AT MIDNIGHT  Responsible adult to drive and be with you for 24 hours? Yes  Name & Phone Number for Ride/Caregiver pt aware transportation and 24 hr care is required, she's working on who that will be (may be sig other, Phillip)  No Jewelry, money, nail polish or make-up.  No lotions, powders, perfumes. No shaving  48 hrs. prior to surgery. Yes  Contacts, Dentures & Glasses Will Have to be Removed Before OR. Yes  Please bring your ID and Insurance Card the morning of your surgery. (Surgery Centers Only) Yes  Bring any papers or x-rays with you that your surgeon gave you. Yes  Instructed to contact the location of procedure/ provider if they or anyone in their household develops symptoms or tests positive for COVID-19, has close contact with someone who tests positive for COVID, or has known exposure to any contagious illness. Yes  Call this number the morning of surgery  with any problems that may cancel your surgery. [phone]  Covid-19 Assessment  Have you had a positive COVID-19  test within the previous 90 days? No  COVID Testing Guidance Proceed with the additional questions.  Patient's surgery required a COVID-19 test (cardiothoracic, complex ENT, and bronchoscopies/ EBUS) No  Have you been unmasked and in close contact with anyone with COVID-19 or COVID-19 symptoms within the past 10 days? No  Do you or anyone in your household currently have any COVID-19 symptoms? No

## 2023-08-07 DIAGNOSIS — Z170 Estrogen receptor positive status [ER+]: Secondary | ICD-10-CM | POA: Diagnosis not present

## 2023-08-07 DIAGNOSIS — C50412 Malignant neoplasm of upper-outer quadrant of left female breast: Secondary | ICD-10-CM | POA: Diagnosis not present

## 2023-08-08 ENCOUNTER — Ambulatory Visit
Admission: RE | Admit: 2023-08-08 | Discharge: 2023-08-08 | Disposition: A | Discharge status: Home / Self Care | Source: Ambulatory Visit | Attending: General Surgery | Admitting: General Surgery

## 2023-08-08 ENCOUNTER — Other Ambulatory Visit: Payer: Self-pay | Admitting: General Surgery

## 2023-08-08 DIAGNOSIS — Z170 Estrogen receptor positive status [ER+]: Secondary | ICD-10-CM

## 2023-08-08 DIAGNOSIS — C50812 Malignant neoplasm of overlapping sites of left female breast: Secondary | ICD-10-CM | POA: Diagnosis not present

## 2023-08-08 HISTORY — PX: BREAST BIOPSY: SHX20

## 2023-08-08 MED ORDER — CHLORHEXIDINE GLUCONATE CLOTH 2 % EX PADS: 6.0000 | MEDICATED_PAD | Freq: Once | CUTANEOUS | Status: DC

## 2023-08-08 MED ORDER — ENSURE PRE-SURGERY PO LIQD: 296.0000 mL | Freq: Once | ORAL | Status: DC

## 2023-08-08 NOTE — Progress Notes (Signed)

## 2023-08-12 ENCOUNTER — Ambulatory Visit (HOSPITAL_BASED_OUTPATIENT_CLINIC_OR_DEPARTMENT_OTHER): Admitting: Anesthesiology

## 2023-08-12 ENCOUNTER — Ambulatory Visit (HOSPITAL_BASED_OUTPATIENT_CLINIC_OR_DEPARTMENT_OTHER)
Admission: RE | Admit: 2023-08-12 | Discharge: 2023-08-12 | Disposition: A | Discharge status: Home / Self Care | Attending: General Surgery | Admitting: General Surgery

## 2023-08-12 ENCOUNTER — Encounter (HOSPITAL_BASED_OUTPATIENT_CLINIC_OR_DEPARTMENT_OTHER): Payer: Self-pay | Admitting: General Surgery

## 2023-08-12 ENCOUNTER — Other Ambulatory Visit: Payer: Self-pay

## 2023-08-12 ENCOUNTER — Encounter (HOSPITAL_BASED_OUTPATIENT_CLINIC_OR_DEPARTMENT_OTHER)
Admission: RE | Disposition: A | Discharge status: Home / Self Care | Payer: Self-pay | Source: Home / Self Care | Attending: General Surgery

## 2023-08-12 ENCOUNTER — Ambulatory Visit
Admission: RE | Admit: 2023-08-12 | Discharge: 2023-08-12 | Disposition: A | Discharge status: Home / Self Care | Source: Ambulatory Visit | Attending: General Surgery | Admitting: General Surgery

## 2023-08-12 DIAGNOSIS — R519 Headache, unspecified: Secondary | ICD-10-CM | POA: Insufficient documentation

## 2023-08-12 DIAGNOSIS — Z1732 Human epidermal growth factor receptor 2 negative status: Secondary | ICD-10-CM | POA: Insufficient documentation

## 2023-08-12 DIAGNOSIS — Z170 Estrogen receptor positive status [ER+]: Secondary | ICD-10-CM | POA: Insufficient documentation

## 2023-08-12 DIAGNOSIS — C50912 Malignant neoplasm of unspecified site of left female breast: Secondary | ICD-10-CM | POA: Diagnosis not present

## 2023-08-12 DIAGNOSIS — Z419 Encounter for procedure for purposes other than remedying health state, unspecified: Secondary | ICD-10-CM

## 2023-08-12 DIAGNOSIS — C50412 Malignant neoplasm of upper-outer quadrant of left female breast: Secondary | ICD-10-CM | POA: Diagnosis not present

## 2023-08-12 DIAGNOSIS — Z79899 Other long term (current) drug therapy: Secondary | ICD-10-CM | POA: Diagnosis not present

## 2023-08-12 DIAGNOSIS — F32A Depression, unspecified: Secondary | ICD-10-CM | POA: Diagnosis not present

## 2023-08-12 DIAGNOSIS — N6012 Diffuse cystic mastopathy of left breast: Secondary | ICD-10-CM | POA: Diagnosis not present

## 2023-08-12 DIAGNOSIS — C50812 Malignant neoplasm of overlapping sites of left female breast: Secondary | ICD-10-CM | POA: Diagnosis not present

## 2023-08-12 DIAGNOSIS — Z1721 Progesterone receptor positive status: Secondary | ICD-10-CM | POA: Insufficient documentation

## 2023-08-12 DIAGNOSIS — G8918 Other acute postprocedural pain: Secondary | ICD-10-CM | POA: Diagnosis not present

## 2023-08-12 DIAGNOSIS — Z1722 Progesterone receptor negative status: Secondary | ICD-10-CM | POA: Diagnosis not present

## 2023-08-12 HISTORY — PX: BREAST LUMPECTOMY WITH RADIOACTIVE SEED AND SENTINEL LYMPH NODE BIOPSY: SHX6550

## 2023-08-12 LAB — POCT PREGNANCY, URINE: Preg Test, Ur: NEGATIVE

## 2023-08-12 SURGERY — BREAST LUMPECTOMY WITH RADIOACTIVE SEED AND SENTINEL LYMPH NODE BIOPSY
Anesthesia: General | Laterality: Left

## 2023-08-12 MED ORDER — PROPOFOL 10 MG/ML IV BOLUS
INTRAVENOUS | Status: DC | PRN
  Administered 2023-08-12: 150 mg via INTRAVENOUS
  Administered 2023-08-12: 150 ug/kg/min via INTRAVENOUS

## 2023-08-12 MED ORDER — LIDOCAINE 2% (20 MG/ML) 5 ML SYRINGE
INTRAMUSCULAR | Status: AC
  Filled 2023-08-12: qty 5

## 2023-08-12 MED ORDER — LIDOCAINE 2% (20 MG/ML) 5 ML SYRINGE
INTRAMUSCULAR | Status: DC | PRN
  Administered 2023-08-12: 60 mg via INTRAVENOUS

## 2023-08-12 MED ORDER — MIDAZOLAM HCL 2 MG/2ML IJ SOLN
INTRAMUSCULAR | Status: DC | PRN
  Administered 2023-08-12: 1 mg via INTRAVENOUS

## 2023-08-12 MED ORDER — ACETAMINOPHEN 500 MG PO TABS
ORAL_TABLET | ORAL | Status: AC
  Filled 2023-08-12: qty 2

## 2023-08-12 MED ORDER — ONDANSETRON HCL 4 MG/2ML IJ SOLN
INTRAMUSCULAR | Status: DC | PRN
  Administered 2023-08-12: 4 mg via INTRAVENOUS

## 2023-08-12 MED ORDER — DEXAMETHASONE SODIUM PHOSPHATE 10 MG/ML IJ SOLN
INTRAMUSCULAR | Status: AC
  Filled 2023-08-12: qty 1

## 2023-08-12 MED ORDER — MAGTRACE LYMPHATIC TRACER
INTRAMUSCULAR | Status: DC | PRN
  Administered 2023-08-12: 1.5 mL via INTRAMUSCULAR

## 2023-08-12 MED ORDER — TRAMADOL HCL 50 MG PO TABS: 50.0000 mg | ORAL_TABLET | Freq: Four times a day (QID) | ORAL | 0 refills | Status: AC | PRN

## 2023-08-12 MED ORDER — 0.9 % SODIUM CHLORIDE (POUR BTL) OPTIME
TOPICAL | Status: DC | PRN
  Administered 2023-08-12: 1000 mL

## 2023-08-12 MED ORDER — BUPIVACAINE HCL (PF) 0.25 % IJ SOLN
INTRAMUSCULAR | Status: AC
  Filled 2023-08-12: qty 150

## 2023-08-12 MED ORDER — PROPOFOL 10 MG/ML IV BOLUS
INTRAVENOUS | Status: AC
  Filled 2023-08-12: qty 20

## 2023-08-12 MED ORDER — CEFAZOLIN SODIUM-DEXTROSE 2-4 GM/100ML-% IV SOLN
INTRAVENOUS | Status: AC
  Filled 2023-08-12: qty 100

## 2023-08-12 MED ORDER — FENTANYL CITRATE (PF) 100 MCG/2ML IJ SOLN
INTRAMUSCULAR | Status: AC
  Filled 2023-08-12: qty 2

## 2023-08-12 MED ORDER — MIDAZOLAM HCL 2 MG/2ML IJ SOLN
INTRAMUSCULAR | Status: AC
  Filled 2023-08-12: qty 2

## 2023-08-12 MED ORDER — GABAPENTIN 300 MG PO CAPS: 300.0000 mg | ORAL_CAPSULE | Freq: Once | ORAL | Status: DC

## 2023-08-12 MED ORDER — FENTANYL CITRATE (PF) 100 MCG/2ML IJ SOLN
INTRAMUSCULAR | Status: DC | PRN
  Administered 2023-08-12: 50 ug via INTRAVENOUS

## 2023-08-12 MED ORDER — DEXAMETHASONE SODIUM PHOSPHATE 4 MG/ML IJ SOLN
INTRAMUSCULAR | Status: DC | PRN
Start: 2023-08-12 — End: 2023-08-12
  Administered 2023-08-12: 5 mg via PERINEURAL

## 2023-08-12 MED ORDER — MIDAZOLAM HCL 2 MG/2ML IJ SOLN
2.0000 mg | Freq: Once | INTRAMUSCULAR | Status: AC
  Administered 2023-08-12: 1 mg via INTRAVENOUS

## 2023-08-12 MED ORDER — ONDANSETRON HCL 4 MG/2ML IJ SOLN
INTRAMUSCULAR | Status: AC
  Filled 2023-08-12: qty 2

## 2023-08-12 MED ORDER — CEFAZOLIN SODIUM-DEXTROSE 2-4 GM/100ML-% IV SOLN
2.0000 g | INTRAVENOUS | Status: AC
  Administered 2023-08-12: 2 g via INTRAVENOUS

## 2023-08-12 MED ORDER — ROPIVACAINE HCL 5 MG/ML IJ SOLN
INTRAMUSCULAR | Status: DC | PRN
  Administered 2023-08-12: 30 mL via PERINEURAL

## 2023-08-12 MED ORDER — CLONIDINE HCL (ANALGESIA) 100 MCG/ML EP SOLN
EPIDURAL | Status: DC | PRN
  Administered 2023-08-12: 80 ug

## 2023-08-12 MED ORDER — ACETAMINOPHEN 500 MG PO TABS
1000.0000 mg | ORAL_TABLET | ORAL | Status: AC
  Administered 2023-08-12: 1000 mg via ORAL

## 2023-08-12 MED ORDER — EPHEDRINE SULFATE-NACL 50-0.9 MG/10ML-% IV SOSY
PREFILLED_SYRINGE | INTRAVENOUS | Status: DC | PRN
  Administered 2023-08-12: 10 mg via INTRAVENOUS

## 2023-08-12 MED ORDER — DEXAMETHASONE SODIUM PHOSPHATE 10 MG/ML IJ SOLN
INTRAMUSCULAR | Status: DC | PRN
  Administered 2023-08-12: 8 mg via INTRAVENOUS

## 2023-08-12 MED ORDER — BUPIVACAINE HCL (PF) 0.25 % IJ SOLN
INTRAMUSCULAR | Status: DC | PRN
Start: 2023-08-12 — End: 2023-08-12
  Administered 2023-08-12: 10 mL

## 2023-08-12 MED ORDER — FENTANYL CITRATE (PF) 100 MCG/2ML IJ SOLN
100.0000 ug | Freq: Once | INTRAMUSCULAR | Status: AC
  Administered 2023-08-12: 100 ug via INTRAVENOUS

## 2023-08-12 MED ORDER — LACTATED RINGERS IV SOLN: INTRAVENOUS | Status: DC

## 2023-08-12 MED ORDER — EPHEDRINE 5 MG/ML INJ
INTRAVENOUS | Status: AC
Start: 2023-08-12 — End: 2023-08-12
  Filled 2023-08-12: qty 5

## 2023-08-12 SURGICAL SUPPLY — 47 items
BINDER BREAST LRG (GAUZE/BANDAGES/DRESSINGS) IMPLANT
BINDER BREAST MEDIUM (GAUZE/BANDAGES/DRESSINGS) IMPLANT
BINDER BREAST XLRG (GAUZE/BANDAGES/DRESSINGS) IMPLANT
BINDER BREAST XXLRG (GAUZE/BANDAGES/DRESSINGS) IMPLANT
BLADE SURG 15 STRL LF DISP TIS (BLADE) ×1 IMPLANT
CANISTER SUC SOCK COL 7IN (MISCELLANEOUS) IMPLANT
CANISTER SUCT 1200ML W/VALVE (MISCELLANEOUS) IMPLANT
CHLORAPREP W/TINT 26 (MISCELLANEOUS) ×1 IMPLANT
CLIP APPLIE 9.375 MED OPEN (MISCELLANEOUS) IMPLANT
CLIP TI WIDE RED SMALL 6 (CLIP) ×1 IMPLANT
COVER BACK TABLE 60X90IN (DRAPES) ×1 IMPLANT
COVER MAYO STAND STRL (DRAPES) ×1 IMPLANT
COVER PROBE CYLINDRICAL 5X96 (MISCELLANEOUS) ×1 IMPLANT
DERMABOND ADVANCED .7 DNX12 (GAUZE/BANDAGES/DRESSINGS) ×1 IMPLANT
DRAPE LAPAROSCOPIC ABDOMINAL (DRAPES) ×1 IMPLANT
DRAPE UTILITY XL STRL (DRAPES) ×1 IMPLANT
ELECT COATED BLADE 2.86 ST (ELECTRODE) ×1 IMPLANT
ELECTRODE REM PT RTRN 9FT ADLT (ELECTROSURGICAL) ×1 IMPLANT
GLOVE BIO SURGEON STRL SZ7 (GLOVE) ×2 IMPLANT
GLOVE BIOGEL PI IND STRL 7.5 (GLOVE) ×1 IMPLANT
GOWN STRL REUS W/ TWL LRG LVL3 (GOWN DISPOSABLE) ×2 IMPLANT
HEMOSTAT ARISTA ABSORB 3G PWDR (HEMOSTASIS) IMPLANT
KIT MARKER MARGIN INK (KITS) ×1 IMPLANT
NDL HYPO 25X1 1.5 SAFETY (NEEDLE) ×1 IMPLANT
NDL SAFETY ECLIPSE 18X1.5 (NEEDLE) IMPLANT
NEEDLE HYPO 25X1 1.5 SAFETY (NEEDLE) ×1 IMPLANT
NS IRRIG 1000ML POUR BTL (IV SOLUTION) IMPLANT
PACK BASIN DAY SURGERY FS (CUSTOM PROCEDURE TRAY) ×1 IMPLANT
PENCIL SMOKE EVACUATOR (MISCELLANEOUS) ×1 IMPLANT
RETRACTOR ONETRAX LX 90X20 (MISCELLANEOUS) IMPLANT
SLEEVE SCD COMPRESS KNEE MED (STOCKING) ×1 IMPLANT
SPIKE FLUID TRANSFER (MISCELLANEOUS) IMPLANT
SPONGE T-LAP 4X18 ~~LOC~~+RFID (SPONGE) ×1 IMPLANT
STRIP CLOSURE SKIN 1/2X4 (GAUZE/BANDAGES/DRESSINGS) ×1 IMPLANT
SUT ETHILON 2 0 FS 18 (SUTURE) IMPLANT
SUT MNCRL AB 4-0 PS2 18 (SUTURE) ×1 IMPLANT
SUT MON AB 5-0 PS2 18 (SUTURE) IMPLANT
SUT SILK 2 0 SH (SUTURE) IMPLANT
SUT VIC AB 2-0 SH 27XBRD (SUTURE) ×1 IMPLANT
SUT VIC AB 3-0 SH 27X BRD (SUTURE) ×1 IMPLANT
SUT VIC AB 5-0 PS2 18 (SUTURE) IMPLANT
SYR CONTROL 10ML LL (SYRINGE) ×1 IMPLANT
TOWEL GREEN STERILE FF (TOWEL DISPOSABLE) ×1 IMPLANT
TRACER MAGTRACE VIAL (MISCELLANEOUS) IMPLANT
TRAY FAXITRON CT DISP (TRAY / TRAY PROCEDURE) ×1 IMPLANT
TUBE CONNECTING 20X1/4 (TUBING) IMPLANT
YANKAUER SUCT BULB TIP NO VENT (SUCTIONS) IMPLANT

## 2023-08-12 NOTE — Progress Notes (Signed)
Assisted Dr. Germeroth with left, pectoralis, ultrasound guided block. Side rails up, monitors on throughout procedure. See vital signs in flow sheet. Tolerated Procedure well. 

## 2023-08-12 NOTE — H&P (Signed)
  25 yof who had a screening mammogram with left asymmetry and distortion. She has D density breast tissue. On US  she has a mass that measures 8x7x42mm. Axillary US  is negative. Biopsy shows a grade II IDC with no LVI that is 90% er pos, 70% pr pos, her 2 negative and Ki is 1%. She has no changes today. She has done a monthlong water fast.   Review of Systems: A complete review of systems was obtained from the patient. I have reviewed this information and discussed as appropriate with the patient. See HPI as well for other ROS.  Review of Systems  All other systems reviewed and are negative.  Medical History: Past Medical History:  Diagnosis Date  Anxiety   Patient Active Problem List  Diagnosis  Malignant neoplasm of upper-outer quadrant of left breast in female, estrogen receptor positive (CMS/HHS-HCC)   Past Surgical History:  Procedure Laterality Date  JOINT REPLACEMENT   Allergies  Allergen Reactions  Allergen Ext-Venom-Honey Bee Anaphylaxis   Current Outpatient Medications on File Prior to Visit  Medication Sig Dispense Refill  ascorbic acid, vitamin C, (VITAMIN C) 1000 MG tablet Take 1,000 mg by mouth once daily  buPROPion  (WELLBUTRIN  XL) 150 MG XL tablet Take 1 tablet by mouth every morning  cholecalciferol, vitamin D3, 250 mcg (10,000 unit) Tab Take by mouth once daily  EPINEPHrine  (EPIPEN ) 0.3 mg/0.3 mL auto-injector Inject 0.3 mg into the muscle  ibuprofen 200 mg Cap Take 200 mg by mouth as needed    Family History  Problem Relation Age of Onset  Coronary Artery Disease (Blocked arteries around heart) Father    Social History   Tobacco Use  Smoking Status Never  Smokeless Tobacco Never  Marital status: Unknown  Tobacco Use  Smoking status: Never  Smokeless tobacco: Never  Substance and Sexual Activity  Alcohol use: Yes  Drug use: Not Currently    Objective:   Physical Exam Vitals reviewed.  Constitutional:  Appearance: Normal appearance.  Chest:   Breasts: Left: No inverted nipple, mass or nipple discharge.  Lymphadenopathy:  Upper Body:  Left upper body: No axillary adenopathy.  Neurological:  Mental Status: She is alert.   Assessment and Plan:   Malignant neoplasm of upper-outer quadrant of left breast in female, estrogen receptor positive (CMS/HHS-HCC)  Left breast seed guided lumpectomy, left ax sn biopsy

## 2023-08-12 NOTE — Anesthesia Preprocedure Evaluation (Addendum)
 Anesthesia Evaluation  Patient identified by MRN, date of birth, ID band Patient awake    Reviewed: Allergy  & Precautions, NPO status , Patient's Chart, lab work & pertinent test results  Airway Mallampati: II  TM Distance: >3 FB Neck ROM: Full    Dental no notable dental hx. (+) Dental Advisory Given, Teeth Intact   Pulmonary neg pulmonary ROS   Pulmonary exam normal breath sounds clear to auscultation       Cardiovascular negative cardio ROS Normal cardiovascular exam Rhythm:Regular Rate:Normal     Neuro/Psych  Headaches PSYCHIATRIC DISORDERS  Depression       GI/Hepatic negative GI ROS, Neg liver ROS,,,  Endo/Other  negative endocrine ROS    Renal/GU negative Renal ROS     Musculoskeletal negative musculoskeletal ROS (+)    Abdominal   Peds  Hematology negative hematology ROS (+)   Anesthesia Other Findings   Reproductive/Obstetrics                              Anesthesia Physical Anesthesia Plan  ASA: 2  Anesthesia Plan: General   Post-op Pain Management: Tylenol  PO (pre-op)*, Gabapentin  PO (pre-op)*, Toradol  IV (intra-op)* and Regional block*   Induction: Intravenous  PONV Risk Score and Plan: 4 or greater and Ondansetron , Dexamethasone , Treatment may vary due to age or medical condition, Midazolam , Propofol  infusion and TIVA  Airway Management Planned: LMA  Additional Equipment:   Intra-op Plan:   Post-operative Plan: Extubation in OR  Informed Consent: I have reviewed the patients History and Physical, chart, labs and discussed the procedure including the risks, benefits and alternatives for the proposed anesthesia with the patient or authorized representative who has indicated his/her understanding and acceptance.     Dental advisory given  Plan Discussed with: CRNA  Anesthesia Plan Comments:          Anesthesia Quick Evaluation

## 2023-08-12 NOTE — H&P (View-Only) (Signed)
  25 yof who had a screening mammogram with left asymmetry and distortion. She has D density breast tissue. On US  she has a mass that measures 8x7x42mm. Axillary US  is negative. Biopsy shows a grade II IDC with no LVI that is 90% er pos, 70% pr pos, her 2 negative and Ki is 1%. She has no changes today. She has done a monthlong water fast.   Review of Systems: A complete review of systems was obtained from the patient. I have reviewed this information and discussed as appropriate with the patient. See HPI as well for other ROS.  Review of Systems  All other systems reviewed and are negative.  Medical History: Past Medical History:  Diagnosis Date  Anxiety   Patient Active Problem List  Diagnosis  Malignant neoplasm of upper-outer quadrant of left breast in female, estrogen receptor positive (CMS/HHS-HCC)   Past Surgical History:  Procedure Laterality Date  JOINT REPLACEMENT   Allergies  Allergen Reactions  Allergen Ext-Venom-Honey Bee Anaphylaxis   Current Outpatient Medications on File Prior to Visit  Medication Sig Dispense Refill  ascorbic acid, vitamin C, (VITAMIN C) 1000 MG tablet Take 1,000 mg by mouth once daily  buPROPion  (WELLBUTRIN  XL) 150 MG XL tablet Take 1 tablet by mouth every morning  cholecalciferol, vitamin D3, 250 mcg (10,000 unit) Tab Take by mouth once daily  EPINEPHrine  (EPIPEN ) 0.3 mg/0.3 mL auto-injector Inject 0.3 mg into the muscle  ibuprofen 200 mg Cap Take 200 mg by mouth as needed    Family History  Problem Relation Age of Onset  Coronary Artery Disease (Blocked arteries around heart) Father    Social History   Tobacco Use  Smoking Status Never  Smokeless Tobacco Never  Marital status: Unknown  Tobacco Use  Smoking status: Never  Smokeless tobacco: Never  Substance and Sexual Activity  Alcohol use: Yes  Drug use: Not Currently    Objective:   Physical Exam Vitals reviewed.  Constitutional:  Appearance: Normal appearance.  Chest:   Breasts: Left: No inverted nipple, mass or nipple discharge.  Lymphadenopathy:  Upper Body:  Left upper body: No axillary adenopathy.  Neurological:  Mental Status: She is alert.   Assessment and Plan:   Malignant neoplasm of upper-outer quadrant of left breast in female, estrogen receptor positive (CMS/HHS-HCC)  Left breast seed guided lumpectomy, left ax sn biopsy

## 2023-08-12 NOTE — Anesthesia Procedure Notes (Signed)
 Anesthesia Regional Block: Pectoralis block   Pre-Anesthetic Checklist: , timeout performed,  Correct Patient, Correct Site, Correct Laterality,  Correct Procedure, Correct Position, site marked,  Risks and benefits discussed,  Surgical consent,  Pre-op evaluation,  At surgeon's request and post-op pain management  Laterality: Left  Prep: chloraprep       Needles:   Needle Type: Stimiplex     Needle Length: 9cm      Additional Needles:   Procedures:,,,, ultrasound used (permanent image in chart),,    Narrative:  Start time: 08/12/2023 7:13 AM End time: 08/12/2023 7:33 AM Injection made incrementally with aspirations every 5 mL.  Performed by: Personally  Anesthesiologist: Darlyn Rush, MD  Additional Notes: BP cuff, SpO2 and EKG monitors applied. Sedation begun.  Anesthetic injected incrementally, slowly, and after neg aspirations under direct ultrasound guidance. Good fascial spread noted. Patient tolerated well.

## 2023-08-12 NOTE — Interval H&P Note (Signed)
 History and Physical Interval Note:  08/12/2023 7:03 AM  Ashlee Pena  has presented today for surgery, with the diagnosis of LEFT BREAST CANCER.  The various methods of treatment have been discussed with the patient and family. After consideration of risks, benefits and other options for treatment, the patient has consented to  Procedure(s) with comments: BREAST LUMPECTOMY WITH RADIOACTIVE SEED AND SENTINEL LYMPH NODE BIOPSY (Left) - LEFT BREAST SEED GUIDED LUMPECTOMY LEFT AXILLARY SENTINEL LYMPH NODE BIOPSY as a surgical intervention.  The patient's history has been reviewed, patient examined, no change in status, stable for surgery.  I have reviewed the patient's chart and labs.  Questions were answered to the patient's satisfaction.     Donnice Bury

## 2023-08-12 NOTE — Anesthesia Procedure Notes (Signed)
 Procedure Name: LMA Insertion Date/Time: 08/12/2023 7:51 AM  Performed by: Delayne Olam BIRCH, CRNAPre-anesthesia Checklist: Patient identified, Emergency Drugs available, Suction available and Patient being monitored Patient Re-evaluated:Patient Re-evaluated prior to induction Oxygen Delivery Method: Circle system utilized Preoxygenation: Pre-oxygenation with 100% oxygen Induction Type: IV induction Ventilation: Mask ventilation without difficulty LMA: LMA inserted LMA Size: 3.0 Number of attempts: 1 Airway Equipment and Method: Bite block Placement Confirmation: positive ETCO2 Tube secured with: Tape Dental Injury: Teeth and Oropharynx as per pre-operative assessment

## 2023-08-12 NOTE — Transfer of Care (Signed)
 Immediate Anesthesia Transfer of Care Note  Patient: Ashlee Pena  Procedure(s) Performed: Procedure(s) (LRB): BREAST LUMPECTOMY WITH RADIOACTIVE SEED AND SENTINEL LYMPH NODE BIOPSY (Left)  Patient Location: PACU  Anesthesia Type: General  Level of Consciousness: awake, oriented, sedated and patient cooperative  Airway & Oxygen Therapy: Patient Spontanous Breathing and Patient connected to face mask oxygen  Post-op Assessment: Report given to PACU RN and Post -op Vital signs reviewed and stable  Post vital signs: Reviewed and stable  Complications: No apparent anesthesia complications  Last Vitals:  Vitals Value Taken Time  BP 95/55 08/12/23 09:00  Temp 36.2 C 08/12/23 09:00  Pulse 78 08/12/23 09:00  Resp 18 08/12/23 09:00  SpO2 97 % 08/12/23 09:00  Vitals shown include unfiled device data.  Last Pain:  Vitals:   08/12/23 0636  TempSrc: Temporal  PainSc:       Patients Stated Pain Goal: 3 (08/12/23 9376)  Complications: No notable events documented.

## 2023-08-12 NOTE — Discharge Instructions (Addendum)
 [facility] Office Phone Number [phone]  POST OP INSTRUCTIONS Take 400 mg of ibuprofen every 8 hours or 650 mg tylenol  every 6 hours for next 72 hours then as needed. Use ice several times daily also.  A prescription for pain medication may be given to you upon discharge.  Take your pain medication as prescribed, if needed.  If narcotic pain medicine is not needed, then you may take acetaminophen  (Tylenol ), naprosyn (Alleve) or ibuprofen (Advil) as needed. Take your usually prescribed medications unless otherwise directed If you need a refill on your pain medication, please contact your pharmacy.  They will contact our office to request authorization.  Prescriptions will not be filled after 5pm or on week-ends. You should eat very light the first 24 hours after surgery, such as soup, crackers, pudding, etc.  Resume your normal diet the day after surgery. Most patients will experience some swelling and bruising in the breast.  Ice packs and a good support bra will help.  Wear the breast binder provided or a sports bra for 72 hours day and night.  After that wear a sports bra during the day until you return to the office. Swelling and bruising can take several days to resolve.  It is common to experience some constipation if taking pain medication after surgery.  Increasing fluid intake and taking a stool softener will usually help or prevent this problem from occurring.  A mild laxative (Milk of Magnesia or Miralax) should be taken according to package directions if there are no bowel movements after 48 hours. I used skin glue on the incision, you may shower in 24 hours.  The glue will flake off over the next 2-3 weeks.  Any sutures or staples will be removed at the office during your follow-up visit. ACTIVITIES:  You may resume regular daily activities (gradually increasing) beginning the next day.  Wearing a good support bra or sports bra minimizes pain and swelling.  You may have  sexual intercourse when it is comfortable. You may drive when you no longer are taking prescription pain medication, you can comfortably wear a seatbelt, and you can safely maneuver your car and apply brakes. RETURN TO WORK:  ______________________________________________________________________________________ Rosine should see your doctor in the office for a follow-up appointment approximately two weeks after your surgery.  Your doctor's nurse will typically make your follow-up appointment when she calls you with your pathology report.  Expect your pathology report 3-4 business days after your surgery.  You may call to check if you do not hear from us  after three days. OTHER INSTRUCTIONS: _______________________________________________________________________________________________ _____________________________________________________________________________________________________________________________________ _____________________________________________________________________________________________________________________________________ _____________________________________________________________________________________________________________________________________  WHEN TO CALL DR WAKEFIELD: Fever over 101.0 Nausea and/or vomiting. Extreme swelling or bruising. Continued bleeding from incision. Increased pain, redness, or drainage from the incision.  The clinic staff is available to answer your questions during regular business hours.  Please don't hesitate to call and ask to speak to one of the nurses for clinical concerns.  If you have a medical emergency, go to the nearest emergency room or call 911.  A surgeon from [facility] is always on call at the hospital.  For further questions, please visit [email or web address]  Post Anesthesia Home Care Instructions  Activity: Get plenty of rest for the remainder of the day. A responsible individual must stay with  you for 24 hours following the procedure.  For the next 24 hours, DO NOT: -Drive a car -Advertising copywriter -Drink alcoholic beverages -Take any medication unless instructed by your  physician -Make any legal decisions or sign important papers.  Meals: Start with liquid foods such as gelatin or soup. Progress to regular foods as tolerated. Avoid greasy, spicy, heavy foods. If nausea and/or vomiting occur, drink only clear liquids until the nausea and/or vomiting subsides. Call your physician if vomiting continues.  Special Instructions/Symptoms: Your throat may feel dry or sore from the anesthesia or the breathing tube placed in your throat during surgery. If this causes discomfort, gargle with warm salt water. The discomfort should disappear within 24 hours.  May have Tylenol  after 6:30pm if needed.     Regional Anesthesia Blocks  1. You may not be able to move or feel the blocked extremity after a regional anesthetic block. This may last may last from 3-48 hours after placement, but it will go away. The length of time depends on the medication injected and your individual response to the medication. As the nerves start to wake up, you may experience tingling as the movement and feeling returns to your extremity. If the numbness and inability to move your extremity has not gone away after 48 hours, please call your surgeon.   2. The extremity that is blocked will need to be protected until the numbness is gone and the strength has returned. Because you cannot feel it, you will need to take extra care to avoid injury. Because it may be weak, you may have difficulty moving it or using it. You may not know what position it is in without looking at it while the block is in effect.  3. For blocks in the legs and feet, returning to weight bearing and walking needs to be done carefully. You will need to wait until the numbness is entirely gone and the strength has returned. You should be able to move  your leg and foot normally before you try and bear weight or walk. You will need someone to be with you when you first try to ensure you do not fall and possibly risk injury.  4. Bruising and tenderness at the needle site are common side effects and will resolve in a few days.  5. Persistent numbness or new problems with movement should be communicated to the surgeon or the [facility] [phone] [facility] [phone]).

## 2023-08-12 NOTE — Op Note (Signed)
 Preoperative diagnosis: Clinical stage I left breast cancer Postoperative diagnosis: Same as above Procedure: 1.  Left breast radioactive seed guided lumpectomy 2.  Injection of mag trace for sentinel lymph node identification 3.  Left deep axillary sentinel lymph node biopsy Surgeon: Dr. Adina Bury Anesthesia: General With a pectoral block Estimated blood loss: Less than 50 cc Specimens: 1.  Left breast tissue containing seed and clip marked with paint 2.  Additional posterior, inferior, lateral margins marked short superior, long lateral, double deep 3.  Left deep axillary sentinel lymph nodes with highest count of 220 Complications: None Drains: None Sponge count was correct at completion Disposition recovery stable addition  Indications: This is a 43 year old female had a screening mammogram with a left asymmetry and a distortion.  On ultrasound she had an 8 x 7 x 7 mm mass.  Axillary ultrasound was negative.  This was a grade 2 invasive ductal carcinoma that was ER/PR positive.  We discussed proceeding with lumpectomy and sentinel node biopsy.  Procedure: After informed consent was obtained she first underwent a pectoral block.  She was given antibiotics.  SCDs were in place.  She was placed under general anesthesia without complication.  She was prepped and draped in the standard sterile surgical fashion.  Surgical timeout was then performed.  I first injected 1.5 cc of mag trace in the subareolar position and massaged this for 5 minutes.  I then located the seed in the upper inner quadrant.  This was fairly close to the skin so I elected to make a curvilinear incision overlying the seed in the cancer.  I then remove the seed in the surrounding tissue with an attempt to get clear margins in conjunction with what was seen on her 3D imaging as well.  Mammogram confirmed removal of the seed and the clip.  I did a 3D image and I found that I thought I was close to several margins and I  remove these as above.  The posterior margin is now the muscle.  I placed a couple of clips in the cavity.  I then obtained hemostasis.  I then closed the breast tissue with 2-0 Vicryl.  The skin was closed with 3-0 Vicryl and 4-0 Monocryl.  Glue Steri-Strips were eventually applied.  I then was able to identify activity in the low axilla.  I made an incision below the axillary hairline and carried this through the fascia.  I then was able to identify a couple of nodes that had activity and appeared normal.  There were no other brown or palpable nodes present.  There is no more activity present in the axilla.  I then obtained hemostasis.  This was closed with 2-0 Vicryl, 3-0 Vicryl and 4 Monocryl.  Glue and Steri-Strips were applied.  She tolerated this well was extubated and transferred to recovery stable.

## 2023-08-13 ENCOUNTER — Encounter (HOSPITAL_BASED_OUTPATIENT_CLINIC_OR_DEPARTMENT_OTHER): Payer: Self-pay | Admitting: General Surgery

## 2023-08-13 LAB — SURGICAL PATHOLOGY

## 2023-08-13 NOTE — Anesthesia Postprocedure Evaluation (Signed)
 Anesthesia Post Note  Patient: Database administrator  Procedure(s) Performed: BREAST LUMPECTOMY WITH RADIOACTIVE SEED AND SENTINEL LYMPH NODE BIOPSY (Left)     Patient location during evaluation: PACU Anesthesia Type: General Level of consciousness: sedated and patient cooperative Pain management: pain level controlled Vital Signs Assessment: post-procedure vital signs reviewed and stable Respiratory status: spontaneous breathing Cardiovascular status: stable Anesthetic complications: no   No notable events documented.  Last Vitals:  Vitals:   08/12/23 0915 08/12/23 0922  BP: 99/64 95/75  Pulse: 76 67  Resp: 16 14  Temp:  (!) 36.2 C  SpO2: 97% 98%    Last Pain:  Vitals:   08/12/23 0922  TempSrc:   PainSc: 0-No pain                 Norleen Pope

## 2023-08-14 ENCOUNTER — Encounter: Payer: Self-pay | Admitting: *Deleted

## 2023-08-15 ENCOUNTER — Telehealth: Payer: Self-pay | Admitting: *Deleted

## 2023-08-15 NOTE — Telephone Encounter (Signed)
Ordered oncotype per Dr. Chryl Heck. Sent requisition to exact sciences and pathology

## 2023-08-17 ENCOUNTER — Encounter (HOSPITAL_COMMUNITY): Payer: Self-pay | Admitting: Emergency Medicine

## 2023-08-17 ENCOUNTER — Ambulatory Visit (HOSPITAL_COMMUNITY)
Admission: EM | Admit: 2023-08-17 | Discharge: 2023-08-17 | Disposition: A | Discharge status: Home / Self Care | Attending: Family Medicine | Admitting: Family Medicine

## 2023-08-17 DIAGNOSIS — Z5189 Encounter for other specified aftercare: Secondary | ICD-10-CM | POA: Diagnosis not present

## 2023-08-17 NOTE — ED Triage Notes (Signed)
 Pt st's she had a left breast lumpectomy and left lymph node biopsy on 08/12/23  Pt st's she has been having pain at the biopsy area.  Incision clean and dry with steri strips in place

## 2023-08-19 ENCOUNTER — Encounter (HOSPITAL_BASED_OUTPATIENT_CLINIC_OR_DEPARTMENT_OTHER): Payer: Self-pay | Admitting: General Surgery

## 2023-08-19 NOTE — Progress Notes (Signed)
   08/19/23 1513  PAT Phone Screen  Is the patient taking a GLP-1 receptor agonist? No  Do You Have Diabetes? No  Do You Have Hypertension? No  Have You Ever Been to the ER for Asthma? No  Have You Taken Oral Steroids in the Past 3 Months? No  Do you Take Phenteramine or any Other Diet Drugs? No  Recent  Lab Work, EKG, CXR? No  Do you have a history of heart problems? No  Any Recent Hospitalizations? No (Seen in UC on Saturday 7/12 for pain and swelling in underarm area where SLN incision is.)  Height 5' 5 (1.651 m)  Weight 57.6 kg  Pat Appointment Scheduled No  Reason for No Appointment Not Needed   I called [facility] triage RN Sari and let her know that pt still has pain and swelling at the axillary surgical site. Area is not draining but is warm to touch. Pt denies fever. Sari stated that she will let Dr Ebbie know.

## 2023-08-21 NOTE — ED Provider Notes (Signed)
 [facility]   [phone] 08/17/23 Arrival Time: 1229  ASSESSMENT & PLAN:  1. Visit for wound check    No signs of infection. Steri-strips removed. She is comfortable with home observation. Will call her surgeon on Monday morning.   Follow-up Information     [facility] at [facility].   Specialty: Urgent Care Why: If worsening or failing to improve as anticipated. Contact information: [address] [place]  [phone]                Reviewed expectations re: course of current medical issues. Questions answered. Outlined signs and symptoms indicating need for more acute intervention. Understanding verbalized. After Visit Summary given.  SUBJECTIVE: History from: Patient. Rosena Bartle is a 43 y.o. female. Pt st's she had a left breast lumpectomy and left lymph node biopsy on 08/12/23  Pt st's she has been having pain at the biopsy area of L axilla. Denies drainage/bleeding/surrounding erythema.   OBJECTIVE:  Vitals:   08/17/23 1416  BP: 121/68  Pulse: 83  Resp: 18  Temp: 98.1 F (36.7 C)  TempSrc: Oral  SpO2: 98%    General appearance: alert; no distress Extremities: no edema; L axilla incision site looks good; without erythema/significant swelling/drainage; mild TTP Skin: warm and dry Neurologic: normal gait Psychological: alert and cooperative; normal mood and affect    Allergies  Allergen Reactions   Other Anaphylaxis    Honey bees    Past Medical History:  Diagnosis Date   Breast cancer (HCC)    Chronic fatigue    Migraines    Social History   Socioeconomic History   Marital status: Single    Spouse name: Not on file   Number of children: Not on file   Years of education: Not on file   Highest education level: Not on file  Occupational History   Not on file  Tobacco Use   Smoking status: Never   Smokeless tobacco: Never  Vaping Use   Vaping status: Never Used  Substance  and Sexual Activity   Alcohol use: Yes    Alcohol/week: 1.0 standard drink of alcohol    Types: 1 Standard drinks or equivalent per week   Drug use: No   Sexual activity: Yes    Partners: Male    Birth control/protection: Inserts  Other Topics Concern   Not on file  Social History Narrative   Not on file   Social Drivers of Health   Financial Resource Strain: Not on file  Food Insecurity: Food Insecurity Present (05/29/2023)   Hunger Vital Sign    Worried About Running Out of Food in the Last Year: Sometimes true    Ran Out of Food in the Last Year: Sometimes true  Transportation Needs: No Transportation Needs (05/29/2023)   PRAPARE - Administrator, Civil Service (Medical): No    Lack of Transportation (Non-Medical): No  Physical Activity: Not on file  Stress: Not on file  Social Connections: Unknown (06/20/2021)   Received from [facility]   Social Network    Social Network: Not on file  Intimate Partner Violence: Not At Risk (05/29/2023)   Humiliation, Afraid, Rape, and Kick questionnaire    Fear of Current or Ex-Partner: No    Emotionally Abused: No    Physically Abused: No    Sexually Abused: No   Family History  Problem Relation Age of Onset   Heart disease Father 18  died   Chronic fatigue Father    Stomach cancer Maternal Uncle        dx >50   Dementia Maternal Grandfather    Skin cancer Paternal Grandfather        melanoma   Hypertension Paternal Grandfather    Stroke Paternal Grandfather    Heart attack Paternal Grandfather    Alzheimer's disease Paternal Great-grandfather    Breast cancer Other 59       PGF's sister   Past Surgical History:  Procedure Laterality Date   BREAST BIOPSY Left 05/16/2023   US  LT BREAST BX W LOC DEV 1ST LESION IMG BX SPEC US  GUIDE 05/16/2023 [facility]   BREAST BIOPSY Right 06/11/2023   US  RT BREAST BX W LOC DEV 1ST LESION IMG BX SPEC US  GUIDE 06/11/2023 [facility]   BREAST BIOPSY Left 08/08/2023    US  LT RADIOACTIVE SEED LOC 08/08/2023 [facility]   BREAST LUMPECTOMY WITH RADIOACTIVE SEED AND SENTINEL LYMPH NODE BIOPSY Left 08/12/2023   Procedure: BREAST LUMPECTOMY WITH RADIOACTIVE SEED AND SENTINEL LYMPH NODE BIOPSY;  Surgeon: Ebbie Cough, MD;  Location: [facility];  Service: General;  Laterality: Left;  LEFT BREAST SEED GUIDED LUMPECTOMY LEFT AXILLARY SENTINEL LYMPH NODE BIOPSY   CLOSED REDUCTION CLAVICLE FRACTURE Right 2011     Rolinda Rogue, MD 08/21/23 [phone]

## 2023-08-22 DIAGNOSIS — C50412 Malignant neoplasm of upper-outer quadrant of left female breast: Secondary | ICD-10-CM | POA: Diagnosis not present

## 2023-08-22 DIAGNOSIS — Z170 Estrogen receptor positive status [ER+]: Secondary | ICD-10-CM | POA: Diagnosis not present

## 2023-08-22 NOTE — Anesthesia Preprocedure Evaluation (Addendum)
 Anesthesia Evaluation  Patient identified by MRN, date of birth, ID band Patient awake    Reviewed: Allergy  & Precautions, NPO status , Patient's Chart, lab work & pertinent test results  History of Anesthesia Complications Negative for: history of anesthetic complications  Airway Mallampati: I  TM Distance: >3 FB Neck ROM: Full    Dental  (+) Dental Advisory Given   Pulmonary neg pulmonary ROS   Pulmonary exam normal breath sounds clear to auscultation       Cardiovascular negative cardio ROS  Rhythm:Regular Rate:Normal     Neuro/Psych  Headaches, neg Seizures PSYCHIATRIC DISORDERS  Depression       GI/Hepatic negative GI ROS, Neg liver ROS,,,  Endo/Other  negative endocrine ROS    Renal/GU negative Renal ROS     Musculoskeletal   Abdominal   Peds  Hematology negative hematology ROS (+) Lab Results      Component                Value               Date                      WBC                      6.3                 05/29/2023                HGB                      14.5                05/29/2023                HCT                      40.9                05/29/2023                MCV                      90.7                05/29/2023                PLT                      158                 05/29/2023              Anesthesia Other Findings   Reproductive/Obstetrics Left breast cancer                              Anesthesia Physical Anesthesia Plan  ASA: 2  Anesthesia Plan: General   Post-op Pain Management: Tylenol  PO (pre-op)*   Induction: Intravenous  PONV Risk Score and Plan: 3 and Ondansetron , Dexamethasone , Propofol  infusion, TIVA, Midazolam  and Treatment may vary due to age or medical condition  Airway Management Planned: LMA  Additional Equipment:   Intra-op Plan:   Post-operative Plan: Extubation in OR  Informed Consent: I have reviewed the patients  History and Physical, chart, labs and discussed the  procedure including the risks, benefits and alternatives for the proposed anesthesia with the patient or authorized representative who has indicated his/her understanding and acceptance.     Dental advisory given  Plan Discussed with: CRNA and Anesthesiologist  Anesthesia Plan Comments: (Risks of general anesthesia discussed including, but not limited to, sore throat, hoarse voice, chipped/damaged teeth, injury to vocal cords, nausea and vomiting, allergic reactions, lung infection, heart attack, stroke, and death. All questions answered. )         Anesthesia Quick Evaluation

## 2023-08-23 ENCOUNTER — Encounter (HOSPITAL_COMMUNITY): Payer: Self-pay

## 2023-08-23 ENCOUNTER — Other Ambulatory Visit: Payer: Self-pay | Admitting: General Surgery

## 2023-08-26 ENCOUNTER — Ambulatory Visit (HOSPITAL_BASED_OUTPATIENT_CLINIC_OR_DEPARTMENT_OTHER)
Admission: RE | Admit: 2023-08-26 | Discharge: 2023-08-26 | Disposition: A | Discharge status: Home / Self Care | Attending: General Surgery | Admitting: General Surgery

## 2023-08-26 ENCOUNTER — Ambulatory Visit (HOSPITAL_BASED_OUTPATIENT_CLINIC_OR_DEPARTMENT_OTHER): Payer: Self-pay | Admitting: Anesthesiology

## 2023-08-26 ENCOUNTER — Other Ambulatory Visit: Payer: Self-pay

## 2023-08-26 ENCOUNTER — Encounter (HOSPITAL_BASED_OUTPATIENT_CLINIC_OR_DEPARTMENT_OTHER): Payer: Self-pay | Admitting: General Surgery

## 2023-08-26 ENCOUNTER — Encounter (HOSPITAL_BASED_OUTPATIENT_CLINIC_OR_DEPARTMENT_OTHER)
Admission: RE | Disposition: A | Discharge status: Home / Self Care | Payer: Self-pay | Source: Home / Self Care | Attending: General Surgery

## 2023-08-26 DIAGNOSIS — C50912 Malignant neoplasm of unspecified site of left female breast: Secondary | ICD-10-CM | POA: Diagnosis not present

## 2023-08-26 DIAGNOSIS — Z170 Estrogen receptor positive status [ER+]: Secondary | ICD-10-CM | POA: Insufficient documentation

## 2023-08-26 DIAGNOSIS — L7634 Postprocedural seroma of skin and subcutaneous tissue following other procedure: Secondary | ICD-10-CM | POA: Diagnosis not present

## 2023-08-26 DIAGNOSIS — Z01818 Encounter for other preprocedural examination: Secondary | ICD-10-CM

## 2023-08-26 DIAGNOSIS — F419 Anxiety disorder, unspecified: Secondary | ICD-10-CM | POA: Insufficient documentation

## 2023-08-26 DIAGNOSIS — Z1721 Progesterone receptor positive status: Secondary | ICD-10-CM | POA: Diagnosis not present

## 2023-08-26 DIAGNOSIS — C50412 Malignant neoplasm of upper-outer quadrant of left female breast: Secondary | ICD-10-CM

## 2023-08-26 DIAGNOSIS — M96843 Postprocedural seroma of a musculoskeletal structure following other procedure: Secondary | ICD-10-CM | POA: Insufficient documentation

## 2023-08-26 DIAGNOSIS — Z1732 Human epidermal growth factor receptor 2 negative status: Secondary | ICD-10-CM | POA: Diagnosis not present

## 2023-08-26 HISTORY — PX: BREAST LUMPECTOMY: SHX2

## 2023-08-26 LAB — POCT PREGNANCY, URINE: Preg Test, Ur: NEGATIVE

## 2023-08-26 SURGERY — BREAST LUMPECTOMY
Anesthesia: General | Site: Breast | Laterality: Left

## 2023-08-26 MED ORDER — CEFAZOLIN SODIUM-DEXTROSE 2-4 GM/100ML-% IV SOLN
2.0000 g | INTRAVENOUS | Status: AC
  Administered 2023-08-26: 2 g via INTRAVENOUS

## 2023-08-26 MED ORDER — ACETAMINOPHEN 500 MG PO TABS
1000.0000 mg | ORAL_TABLET | Freq: Once | ORAL | Status: AC
  Administered 2023-08-26: 1000 mg via ORAL

## 2023-08-26 MED ORDER — LIDOCAINE HCL (PF) 1 % IJ SOLN
INTRAMUSCULAR | Status: AC
  Filled 2023-08-26: qty 30

## 2023-08-26 MED ORDER — PROPOFOL 500 MG/50ML IV EMUL
INTRAVENOUS | Status: AC
  Filled 2023-08-26: qty 50

## 2023-08-26 MED ORDER — OXYCODONE HCL 5 MG/5ML PO SOLN: 5.0000 mg | Freq: Once | ORAL | Status: DC | PRN

## 2023-08-26 MED ORDER — PROPOFOL 10 MG/ML IV BOLUS
INTRAVENOUS | Status: AC
  Filled 2023-08-26: qty 20

## 2023-08-26 MED ORDER — LACTATED RINGERS IV SOLN: INTRAVENOUS | Status: DC

## 2023-08-26 MED ORDER — FENTANYL CITRATE (PF) 100 MCG/2ML IJ SOLN
INTRAMUSCULAR | Status: DC | PRN
  Administered 2023-08-26: 25 ug via INTRAVENOUS
  Administered 2023-08-26: 50 ug via INTRAVENOUS
  Administered 2023-08-26: 25 ug via INTRAVENOUS

## 2023-08-26 MED ORDER — PHENYLEPHRINE 80 MCG/ML (10ML) SYRINGE FOR IV PUSH (FOR BLOOD PRESSURE SUPPORT)
PREFILLED_SYRINGE | INTRAVENOUS | Status: AC
  Filled 2023-08-26: qty 10

## 2023-08-26 MED ORDER — LIDOCAINE HCL (CARDIAC) PF 100 MG/5ML IV SOSY
PREFILLED_SYRINGE | INTRAVENOUS | Status: DC | PRN
  Administered 2023-08-26: 50 mg via INTRAVENOUS

## 2023-08-26 MED ORDER — MIDAZOLAM HCL 5 MG/5ML IJ SOLN
INTRAMUSCULAR | Status: DC | PRN
Start: 2023-08-26 — End: 2023-08-26
  Administered 2023-08-26: 2 mg via INTRAVENOUS

## 2023-08-26 MED ORDER — PHENYLEPHRINE HCL (PRESSORS) 10 MG/ML IV SOLN
INTRAVENOUS | Status: DC | PRN
  Administered 2023-08-26 (×3): 40 ug via INTRAVENOUS

## 2023-08-26 MED ORDER — DEXAMETHASONE SODIUM PHOSPHATE 4 MG/ML IJ SOLN
INTRAMUSCULAR | Status: DC | PRN
  Administered 2023-08-26: 8 mg via INTRAVENOUS

## 2023-08-26 MED ORDER — 0.9 % SODIUM CHLORIDE (POUR BTL) OPTIME
TOPICAL | Status: DC | PRN
  Administered 2023-08-26: 1000 mL

## 2023-08-26 MED ORDER — BUPIVACAINE HCL (PF) 0.25 % IJ SOLN
INTRAMUSCULAR | Status: DC | PRN
  Administered 2023-08-26: 7 mL

## 2023-08-26 MED ORDER — BUPIVACAINE HCL (PF) 0.25 % IJ SOLN
INTRAMUSCULAR | Status: AC
  Filled 2023-08-26: qty 30

## 2023-08-26 MED ORDER — DEXAMETHASONE SODIUM PHOSPHATE 10 MG/ML IJ SOLN
INTRAMUSCULAR | Status: AC
  Filled 2023-08-26: qty 1

## 2023-08-26 MED ORDER — CEFAZOLIN SODIUM-DEXTROSE 2-4 GM/100ML-% IV SOLN
INTRAVENOUS | Status: AC
  Filled 2023-08-26: qty 100

## 2023-08-26 MED ORDER — GLYCOPYRROLATE PF 0.2 MG/ML IJ SOSY
PREFILLED_SYRINGE | INTRAMUSCULAR | Status: AC
  Filled 2023-08-26: qty 1

## 2023-08-26 MED ORDER — FENTANYL CITRATE (PF) 100 MCG/2ML IJ SOLN
INTRAMUSCULAR | Status: AC
  Filled 2023-08-26: qty 2

## 2023-08-26 MED ORDER — PROPOFOL 10 MG/ML IV BOLUS
INTRAVENOUS | Status: DC | PRN
  Administered 2023-08-26: 150 mg via INTRAVENOUS
  Administered 2023-08-26: 150 ug/kg/min via INTRAVENOUS
  Administered 2023-08-26: 50 mg via INTRAVENOUS

## 2023-08-26 MED ORDER — MIDAZOLAM HCL 2 MG/2ML IJ SOLN
INTRAMUSCULAR | Status: AC
  Filled 2023-08-26: qty 2

## 2023-08-26 MED ORDER — ONDANSETRON HCL 4 MG/2ML IJ SOLN
INTRAMUSCULAR | Status: AC
  Filled 2023-08-26: qty 2

## 2023-08-26 MED ORDER — LIDOCAINE 2% (20 MG/ML) 5 ML SYRINGE
INTRAMUSCULAR | Status: AC
  Filled 2023-08-26: qty 5

## 2023-08-26 MED ORDER — SUCCINYLCHOLINE CHLORIDE 200 MG/10ML IV SOSY
PREFILLED_SYRINGE | INTRAVENOUS | Status: DC | PRN
Start: 2023-08-26 — End: 2023-08-26
  Administered 2023-08-26: 80 mg via INTRAVENOUS

## 2023-08-26 MED ORDER — FENTANYL CITRATE (PF) 100 MCG/2ML IJ SOLN: 25.0000 ug | INTRAMUSCULAR | Status: DC | PRN

## 2023-08-26 MED ORDER — OXYCODONE HCL 5 MG PO TABS: 5.0000 mg | ORAL_TABLET | Freq: Once | ORAL | Status: DC | PRN

## 2023-08-26 MED ORDER — AMISULPRIDE (ANTIEMETIC) 5 MG/2ML IV SOLN: 10.0000 mg | Freq: Once | INTRAVENOUS | Status: DC | PRN

## 2023-08-26 MED ORDER — ACETAMINOPHEN 500 MG PO TABS
ORAL_TABLET | ORAL | Status: AC
  Filled 2023-08-26: qty 2

## 2023-08-26 MED ORDER — SUCCINYLCHOLINE CHLORIDE 200 MG/10ML IV SOSY
PREFILLED_SYRINGE | INTRAVENOUS | Status: AC
  Filled 2023-08-26: qty 10

## 2023-08-26 MED ORDER — CHLORHEXIDINE GLUCONATE CLOTH 2 % EX PADS: 6.0000 | MEDICATED_PAD | Freq: Once | CUTANEOUS | Status: DC

## 2023-08-26 MED ORDER — ONDANSETRON HCL 4 MG/2ML IJ SOLN
INTRAMUSCULAR | Status: DC | PRN
  Administered 2023-08-26: 4 mg via INTRAVENOUS

## 2023-08-26 SURGICAL SUPPLY — 46 items
BINDER BREAST LRG (GAUZE/BANDAGES/DRESSINGS) IMPLANT
BINDER BREAST MEDIUM (GAUZE/BANDAGES/DRESSINGS) IMPLANT
BINDER BREAST XLRG (GAUZE/BANDAGES/DRESSINGS) IMPLANT
BINDER BREAST XXLRG (GAUZE/BANDAGES/DRESSINGS) IMPLANT
BLADE SURG 15 STRL LF DISP TIS (BLADE) ×1 IMPLANT
CANISTER SUCT 1200ML W/VALVE (MISCELLANEOUS) IMPLANT
CHLORAPREP W/TINT 26 (MISCELLANEOUS) ×1 IMPLANT
CLIP APPLIE 9.375 MED OPEN (MISCELLANEOUS) IMPLANT
CLIP TI WIDE RED SMALL 6 (CLIP) IMPLANT
COVER BACK TABLE 60X90IN (DRAPES) ×1 IMPLANT
COVER MAYO STAND STRL (DRAPES) ×1 IMPLANT
DERMABOND ADVANCED .7 DNX12 (GAUZE/BANDAGES/DRESSINGS) IMPLANT
DRAPE LAPAROSCOPIC ABDOMINAL (DRAPES) ×1 IMPLANT
DRAPE UTILITY XL STRL (DRAPES) ×1 IMPLANT
DRSG TEGADERM 4X4.75 (GAUZE/BANDAGES/DRESSINGS) ×1 IMPLANT
ELECT COATED BLADE 2.86 ST (ELECTRODE) ×1 IMPLANT
ELECTRODE BLDE 4.0 EZ CLN MEGD (MISCELLANEOUS) IMPLANT
ELECTRODE REM PT RTRN 9FT ADLT (ELECTROSURGICAL) ×1 IMPLANT
GAUZE SPONGE 4X4 12PLY STRL LF (GAUZE/BANDAGES/DRESSINGS) ×1 IMPLANT
GLOVE BIO SURGEON STRL SZ7 (GLOVE) ×1 IMPLANT
GLOVE BIOGEL PI IND STRL 7.5 (GLOVE) ×1 IMPLANT
GOWN STRL REUS W/ TWL LRG LVL3 (GOWN DISPOSABLE) ×3 IMPLANT
HEMOSTAT ARISTA ABSORB 3G PWDR (HEMOSTASIS) IMPLANT
KIT MARKER MARGIN INK (KITS) ×1 IMPLANT
NDL HYPO 25X1 1.5 SAFETY (NEEDLE) ×1 IMPLANT
NEEDLE HYPO 25X1 1.5 SAFETY (NEEDLE) ×1 IMPLANT
NS IRRIG 1000ML POUR BTL (IV SOLUTION) IMPLANT
PACK BASIN DAY SURGERY FS (CUSTOM PROCEDURE TRAY) ×1 IMPLANT
PENCIL SMOKE EVACUATOR (MISCELLANEOUS) ×1 IMPLANT
RETRACTOR ONETRAX LX 90X20 (MISCELLANEOUS) IMPLANT
SLEEVE SCD COMPRESS KNEE MED (STOCKING) ×1 IMPLANT
SPIKE FLUID TRANSFER (MISCELLANEOUS) IMPLANT
SPONGE T-LAP 4X18 ~~LOC~~+RFID (SPONGE) ×1 IMPLANT
STRIP CLOSURE SKIN 1/2X4 (GAUZE/BANDAGES/DRESSINGS) IMPLANT
SUT MNCRL AB 4-0 PS2 18 (SUTURE) IMPLANT
SUT MON AB 5-0 PS2 18 (SUTURE) IMPLANT
SUT SILK 2 0 SH (SUTURE) ×1 IMPLANT
SUT VIC AB 2-0 SH 27XBRD (SUTURE) ×1 IMPLANT
SUT VIC AB 3-0 SH 27X BRD (SUTURE) ×1 IMPLANT
SUT VIC AB 5-0 PS2 18 (SUTURE) IMPLANT
SUT VICRYL AB 3 0 TIES (SUTURE) IMPLANT
SYR CONTROL 10ML LL (SYRINGE) ×1 IMPLANT
TOWEL GREEN STERILE FF (TOWEL DISPOSABLE) ×1 IMPLANT
TRAY FAXITRON CT DISP (TRAY / TRAY PROCEDURE) IMPLANT
TUBE CONNECTING 20X1/4 (TUBING) IMPLANT
YANKAUER SUCT BULB TIP NO VENT (SUCTIONS) IMPLANT

## 2023-08-26 NOTE — Op Note (Signed)
 Preoperative diagnosis: Clinical stage I left breast cancer status postlumpectomy and sentinel node, positive anterior margin Postoperative diagnosis: Same as above Procedure: 1.  Reexcision of left breast anterior margin 2.  Aspiration of left axillary seroma Surgeon: Dr. Adina Bury Anesthesia: General Specimens: Anterior margin left breast marked short superior, long lateral Complications: None Drains: None Estimated blood loss: Minimal Disposition recovery stable condition  Indications: This a 43 year old female who was undergone lumpectomy and sentinel node biopsy.  She has a low Oncotype.  Her anterior margin was positive and we discussed returning to the operating room to excise that.  We also discussed aspirating a small axillary seroma.  Procedure: After informed consent was obtained she was taken to the operating room.  She was given antibiotics.  SCDs were placed.  She was placed under general anesthesia without complication.  She was prepped and draped in standard sterile surgical fashion.  Surgical timeout was then performed.  I aspirated about 40 cc of a left axillary seroma.  I then made a crescent shaped incision surrounding my old incision.  This certainly made the incision longer but was necessary to clear this anterior margin.  I then remove the anterior margin into the cavity so there is no more margin to take at this position.  I marked this as above.  I hemostasis was obtained.  I undermined the skin in all directions so that I could drape it back over the incision eventually.  I then closed the breast tissue with 2-0 Vicryl.  The dermis was closed with 3-0 Vicryl and the skin with 4-0 Monocryl.  Glue and Steri-Strips were applied.  She tolerated this well was extubated and transferred to recovery in stable condition.

## 2023-08-26 NOTE — Anesthesia Postprocedure Evaluation (Signed)
 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  Last Vitals:  Vitals:   08/26/23 1315 08/26/23 1330  BP: 106/62 101/70  Pulse: 81 62  Resp: 19 13  Temp:    SpO2: 98% 100%    Last Pain:  Vitals:   08/26/23 1330  TempSrc:   PainSc: 0-No pain                 Delon Aisha Arch

## 2023-08-26 NOTE — Anesthesia Procedure Notes (Signed)
 Procedure Name: Intubation Date/Time: 08/26/2023 12:25 PM  Performed by: Rhodia Debby MATSU, CRNAPre-anesthesia Checklist: Patient identified, Emergency Drugs available, Suction available, Patient being monitored and Timeout performed Patient Re-evaluated:Patient Re-evaluated prior to induction Oxygen Delivery Method: Circle system utilized Preoxygenation: Pre-oxygenation with 100% oxygen Induction Type: IV induction Ventilation: Mask ventilation without difficulty LMA: LMA inserted LMA Size: 4.0 Laryngoscope Size: Miller and 2 Grade View: Grade I Tube type: Oral Tube size: 7.0 mm Number of attempts: 1 Airway Equipment and Method: Stylet Placement Confirmation: ETT inserted through vocal cords under direct vision, positive ETCO2 and breath sounds checked- equal and bilateral Secured at: 20 cm Tube secured with: Tape Dental Injury: Teeth and Oropharynx as per pre-operative assessment  Comments: Placed lubricated (ky) LMA after induction good seal bilateral BBS=+ ETCO2 positive, patient developed hiccoughs and decided to change plan to ETT so that surgery would be unimpeded, Smooth dvl LMA discontinued and DVL Grade 1 Easily placed 7.0 ETT

## 2023-08-26 NOTE — Discharge Instructions (Addendum)
 [facility] Office Phone Number [phone]  POST OP INSTRUCTIONS Take 400 mg of ibuprofen every 8 hours or 650 mg tylenol  every 6 hours for next 72 hours then as needed. Use ice several times daily also.  A prescription for pain medication may be given to you upon discharge.  Take your pain medication as prescribed, if needed.  If narcotic pain medicine is not needed, then you may take acetaminophen  (Tylenol ), naprosyn (Alleve) or ibuprofen (Advil) as needed. Take your usually prescribed medications unless otherwise directed If you need a refill on your pain medication, please contact your pharmacy.  They will contact our office to request authorization.  Prescriptions will not be filled after 5pm or on week-ends. You should eat very light the first 24 hours after surgery, such as soup, crackers, pudding, etc.  Resume your normal diet the day after surgery. Most patients will experience some swelling and bruising in the breast.  Ice packs and a good support bra will help.  Wear the breast binder provided or a sports bra for 72 hours day and night.  After that wear a sports bra during the day until you return to the office. Swelling and bruising can take several days to resolve.  It is common to experience some constipation if taking pain medication after surgery.  Increasing fluid intake and taking a stool softener will usually help or prevent this problem from occurring.  A mild laxative (Milk of Magnesia or Miralax) should be taken according to package directions if there are no bowel movements after 48 hours. I used skin glue on the incision, you may shower in 24 hours.  The glue will flake off over the next 2-3 weeks as will the steristrips.   Any sutures or staples will be removed at the office during your follow-up visit. ACTIVITIES:  You may resume regular daily activities (gradually increasing) beginning the next day.  Wearing a good support bra or sports bra minimizes pain and  swelling.  You may have sexual intercourse when it is comfortable. You may drive when you no longer are taking prescription pain medication, you can comfortably wear a seatbelt, and you can safely maneuver your car and apply brakes. RETURN TO WORK:  ______________________________________________________________________________________ Rosine should see your doctor in the office for a follow-up appointment approximately two to three weeks after your surgery.  Your doctor's nurse will typically make your follow-up appointment when she calls you with your pathology report.  Expect your pathology report 3-4 business days after your surgery.  You may call to check if you do not hear from us  after three days.  WHEN TO CALL DR WAKEFIELD: Fever over 101.0 Nausea and/or vomiting. Extreme swelling or bruising. Continued bleeding from incision. Increased pain, redness, or drainage from the incision.  The clinic staff is available to answer your questions during regular business hours.  Please don't hesitate to call and ask to speak to one of the nurses for clinical concerns.  If you have a medical emergency, go to the nearest emergency room or call 911.  A surgeon from [facility] is always on call at the hospital.  For further questions, please visit [email or web address]  Post Anesthesia Home Care Instructions  Activity: Get plenty of rest for the remainder of the day. A responsible individual must stay with you for 24 hours following the procedure.  For the next 24 hours, DO NOT: -Drive a car -Advertising copywriter -Drink alcoholic beverages -Take any medication unless instructed by your  physician -Make any legal decisions or sign important papers.  Meals: Start with liquid foods such as gelatin or soup. Progress to regular foods as tolerated. Avoid greasy, spicy, heavy foods. If nausea and/or vomiting occur, drink only clear liquids until the nausea and/or vomiting subsides. Call  your physician if vomiting continues.  Special Instructions/Symptoms: Your throat may feel dry or sore from the anesthesia or the breathing tube placed in your throat during surgery. If this causes discomfort, gargle with warm salt water. The discomfort should disappear within 24 hours.  If you had a scopolamine patch placed behind your ear for the management of post- operative nausea and/or vomiting:  1. The medication in the patch is effective for 72 hours, after which it should be removed.  Wrap patch in a tissue and discard in the trash. Wash hands thoroughly with soap and water. 2. You may remove the patch earlier than 72 hours if you experience unpleasant side effects which may include dry mouth, dizziness or visual disturbances. 3. Avoid touching the patch. Wash your hands with soap and water after contact with the patch.     Last received tylenol  at 1030am

## 2023-08-26 NOTE — Interval H&P Note (Signed)
 History and Physical Interval Note:  08/26/2023 11:44 AM  Ashlee Pena  has presented today for surgery, with the diagnosis of LEFT BREAST CANCER WITH POSITIVE MARGIN.  The various methods of treatment have been discussed with the patient and family. After consideration of risks, benefits and other options for treatment, the patient has consented to  Procedure(s) with comments: BREAST LUMPECTOMY (Left) - LEFT BREAST RE-EXCISION LUMPECTOMY as a surgical intervention.  The patient's history has been reviewed, patient examined, no change in status, stable for surgery.  I have reviewed the patient's chart and labs.  Questions were answered to the patient's satisfaction.     Donnice Bury

## 2023-08-26 NOTE — Transfer of Care (Signed)
 Immediate Anesthesia Transfer of Care Note  Patient: Ashlee Pena  Procedure(s) Performed: BREAST LUMPECTOMY RE-Exsision (Left: Breast)  Patient Location: PACU  Anesthesia Type:General  Level of Consciousness: awake, alert , oriented, and patient cooperative  Airway & Oxygen Therapy: Patient Spontanous Breathing and Patient connected to face mask oxygen  Post-op Assessment: Report given to RN and Post -op Vital signs reviewed and stable  Post vital signs: Reviewed and stable  Last Vitals:  Vitals Value Taken Time  BP 100/59 08/26/23 13:01  Temp    Pulse 86 08/26/23 13:05  Resp 12 08/26/23 13:05  SpO2 98 % 08/26/23 13:05  Vitals shown include unfiled device data.  Last Pain:  Vitals:   08/26/23 1024  TempSrc: Temporal  PainSc: 2       Patients Stated Pain Goal: 5 (08/26/23 1024)  Complications: No notable events documented.

## 2023-08-27 ENCOUNTER — Telehealth: Payer: Self-pay | Admitting: *Deleted

## 2023-08-27 ENCOUNTER — Encounter (HOSPITAL_BASED_OUTPATIENT_CLINIC_OR_DEPARTMENT_OTHER): Payer: Self-pay | Admitting: General Surgery

## 2023-08-27 ENCOUNTER — Encounter: Payer: Self-pay | Admitting: *Deleted

## 2023-08-27 DIAGNOSIS — Z170 Estrogen receptor positive status [ER+]: Secondary | ICD-10-CM

## 2023-08-27 NOTE — Telephone Encounter (Signed)
 Received oncotype results of 0/3%. Left message for a return phone call. Referral placed for Dr. Dewey.

## 2023-08-28 ENCOUNTER — Telehealth: Payer: Self-pay | Admitting: Radiation Oncology

## 2023-08-28 LAB — SURGICAL PATHOLOGY

## 2023-08-28 NOTE — Telephone Encounter (Signed)
 Left message for patient to call back to schedule consult per 7/22 referral.

## 2023-08-29 ENCOUNTER — Telehealth: Payer: Self-pay | Admitting: Radiation Oncology

## 2023-08-29 NOTE — Telephone Encounter (Signed)
 Left message for patient to call back to schedule consult per 7/22 referral.

## 2023-08-30 ENCOUNTER — Encounter: Payer: Self-pay | Admitting: *Deleted

## 2023-09-01 NOTE — Therapy (Unsigned)
 OUTPATIENT PHYSICAL THERAPY BREAST CANCER POST OP FOLLOW UP   Patient Name: Ashlee Pena MRN: [id number] DOB:July 09, 1980, 43 y.o., female Today's Date: 09/02/2023  END OF SESSION:  PT End of Session - 09/02/23 1354     Visit Number 2    Number of Visits 2    Date for PT Re-Evaluation 09/02/23    PT Start Time 1400    PT Stop Time 1453    PT Time Calculation (min) 53 min    Activity Tolerance Patient tolerated treatment well    Behavior During Therapy [facility] for tasks assessed/performed          Past Medical History:  Diagnosis Date   Breast cancer (HCC)    Chronic fatigue    Migraines    Past Surgical History:  Procedure Laterality Date   BREAST BIOPSY Left 05/16/2023   US  LT BREAST BX W LOC DEV 1ST LESION IMG BX SPEC US  GUIDE 05/16/2023 [facility]   BREAST BIOPSY Right 06/11/2023   US  RT BREAST BX W LOC DEV 1ST LESION IMG BX SPEC US  GUIDE 06/11/2023 [facility]   BREAST BIOPSY Left 08/08/2023   US  LT RADIOACTIVE SEED LOC 08/08/2023 [facility]   BREAST LUMPECTOMY Left 08/26/2023   Procedure: BREAST LUMPECTOMY RE-Exsision;  Surgeon: Ebbie Cough, MD;  Location: [facility];  Service: General;  Laterality: Left;  LEFT BREAST RE-EXCISION LUMPECTOMY   BREAST LUMPECTOMY WITH RADIOACTIVE SEED AND SENTINEL LYMPH NODE BIOPSY Left 08/12/2023   Procedure: BREAST LUMPECTOMY WITH RADIOACTIVE SEED AND SENTINEL LYMPH NODE BIOPSY;  Surgeon: Ebbie Cough, MD;  Location: [facility];  Service: General;  Laterality: Left;  LEFT BREAST SEED GUIDED LUMPECTOMY LEFT AXILLARY SENTINEL LYMPH NODE BIOPSY   CLOSED REDUCTION CLAVICLE FRACTURE Right 2011   Patient Active Problem List   Diagnosis Date Noted   Genetic testing 06/10/2023   Malignant neoplasm of upper-outer quadrant of left breast in female, estrogen receptor positive (HCC) 05/27/2023   ANA positive 09/05/2020   Vitamin D  deficiency 09/01/2020   Chronic fatigue 09/01/2020    Depression 09/01/2020   Bee sting allergy  09/01/2020   At risk for prolonged QT interval syndrome 08/31/2019   Headache, menstrual migraine 09/01/2018    PCP:   REFERRING PROVIDER: Dr. Cough Ebbie   REFERRING DIAG:  Left breast cancer   THERAPY DIAG:  Malignant neoplasm of upper-outer quadrant of left breast in female, estrogen receptor positive (HCC)  Cervicalgia  Rationale for Evaluation and Treatment: Rehabilitation  ONSET DATE: 05/13/2023   SUBJECTIVE:  SUBJECTIVE STATEMENT: I have no problem with the lumpectomy incision, but the axilla feels tight and may be swollen. 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 I feel like the worst part is the nerve pain behind my arm. I ran on Friday and I was fine.  PERTINENT HISTORY:  Patient was diagnosed on 05/13/2023 with left grade 2 invasive ductal carcinoma breast cancer. It measures 8 mm and is located in the upper outer quadrant. It is ER/PR positive and HER2 negative with a Ki67 of 1%. She had a clavicle fracture in 2010 which required surgery with a plate placed. On  08/12/2023 she had a left Left Lumpectomy with SLNB, and a Re-excision for positive margin on  08/26/2023 with aspiration of a small seroma. She had 0+/4 LN's   PATIENT GOALS:  Reassess how my recovery is going related to arm function, pain, and swelling.  PAIN:  Are you having pain? No, mainly discomfotr under arm and posterior arm  PRECAUTIONS: Recent Surgery, left UE Lymphedema risk,   RED FLAGS: None   ACTIVITY LEVEL / LEISURE: modified yoga, walking, did a run on Friday and did fine.   OBJECTIVE:   PATIENT SURVEYS:  QUICK DASH: 27.27  OBSERVATIONS: Breast incision with steri strips, axillary incision with mild build up of scar tissue,slightly raised  POSTURE:   WNL  LYMPHEDEMA ASSESSMENT:   UPPER EXTREMITY AROM/PROM:   A/PROM RIGHT   eval    Shoulder extension 55  Shoulder flexion 163  Shoulder abduction 177  Shoulder internal rotation 59  Shoulder external rotation 90                          (Blank rows = not tested)   A/PROM LEFT   eval LEFT 09/02/2023  Shoulder extension 60 57  Shoulder flexion 155 159  Shoulder abduction 177 165  Shoulder internal rotation 62 75  Shoulder external rotation 90 96                          (Blank rows = not tested)   CERVICAL AROM: EVAL     Percent limited  Flexion WNL  Extension WNL  Right lateral flexion 25% limited  Left lateral flexion 75% limited  Right rotation 25% limited  Left rotation 25% limited                 UPPER EXTREMITY STRENGTH: WNL   LYMPHEDEMA ASSESSMENTS (in cm):    LANDMARK RIGHT   eval RIGHT  10 cm proximal to olecranon process 26.6 25.6  Olecranon process 23 22.3  10 cm proximal to ulnar styloid process 21 20.4  Just proximal to ulnar styloid process 14.5   Across hand at thumb web space 18.2   At base of 2nd digit 5.8 5.7  (Blank rows = not tested)   LANDMARK LEFT   eval LEFT 09/02/2023  10 cm proximal to olecranon process 26.9 25.6  Olecranon process 23.5 22.9  10 cm proximal to ulnar styloid process 21 20.2  Just proximal to ulnar styloid process 14.4 14.3  Across hand at thumb web space 17.8 17.9  At base of 2nd digit 5.5 5.5  (Blank rows = not tested)    Surgery type/Date7/08/2023: Left Lumpectomy with SLNB, Re-excision 08/26/2023 Number of lymph nodes removed: 0+/4 Current/past treatment (chemo, radiation, hormone therapy): Pending radiation . Low oncotype so no chemo Other symptoms:  Heaviness/tightness Yes Pain No Pitting edema  No Infections No Decreased scar mobility Yes Stemmer sign No  PATIENT EDUCATION:  Education details: scar massage when healed, reviewed post op exercises and practiced in supine ,and standing for lat. Wall  slide,, SOZO screens, ABC video, continue exs during radiation to prevent stiffness, supportive bra with runs, contact MD prn for axillary incision if it becomes more red, swollen, painful. Person educated: Patient Education method: Explanation, Demonstration, and Handouts Education comprehension: verbalized understanding and returned demonstration  HOME EXERCISE PROGRAM: Reviewed previously given post op HEP which she hadn't really done because she wasn't sure what to do. Practiced all but did AA flexion with wand, and scaption, stargazer supine. Reviewed single arm pectoral stretch in doorway.   ASSESSMENT:  CLINICAL IMPRESSION: Pt is s/p a left lumpectomy with SLNB on 08/12/2023 and a re-excision on 08/26/2023. She is doing very well with ROM and there are no signs of UE swelling. She has steri strips on breast incision but appears to be healing fine. Axillary incision is mildly raised and there are palpable defined borders with swelling noted from where seroma was drained. She was given a pillow to use in the axillary region when she is stationary.to try and decrease edema. She was advised to follow up with MD if swelling or redness worsens or becomes painful. She was scheduled for her SOZO screen, and she will continue her HEP for duration of radiation and slightly beyond. She was advised to watch the ABC video for return to activities, and was reminded to start gently when she resumes exercises, avoiding wt through arms until healed well. She has no present need for therapy at this time but will continue with regular ROM exs at home.She was advised to contact us  with any questions or concerns.  Pt will benefit from skilled therapeutic intervention to improve on the following deficits: Decreased knowledge of precautions, impaired UE functional use, pain, decreased ROM, postural dysfunction.   PT treatment/interventions: ADL/Self care home management, 97110-Therapeutic exercises, 97535- Self Care,  and 02859- Manual therapy   GOALS: Goals reviewed with patient? Yes  LONG TERM GOALS:  (STG=LTG)  GOALS Name Target Date  Goal status  1 Pt will demonstrate she has regained full shoulder ROM and function post operatively compared to baselines.  Baseline: 09/02/2023 MET except abd still mildly limited     PLAN:  PT FREQUENCY/DURATION: No further PT needs identified at this time  PLAN FOR NEXT SESSION: Pt is discharged from formal PT but will reach out with questions or concerns.   [facility]  [address], [address]  [place] [address]  [phone]  After Breast Cancer Class Video It is recommended you view the ABC class video to be educated on lymphedema risk reduction. This video lasts for about 30 minutes. It can be viewed on our website here: [email or web address]  Scar massage You can begin gentle scar massage to you incision sites. Gently place one hand on the incision and move the skin (without sliding on the skin) in various directions. Do this for a few minutes and then you can gently massage either coconut oil or vitamin E cream into the scars.  Compression garment You should continue wearing your compression bra until you feel like you no longer have swelling.  Home exercise Program Continue doing the exercises you were given until you feel like you can do them without feeling any tightness at the end.   Walking Program Studies show that 30 minutes of walking per day (fast enough  to elevate your heart rate) can significantly reduce the risk of a cancer recurrence. If you can't walk due to other medical reasons, we encourage you to find another activity you could do (like a stationary bike or water exercise).  Posture After breast cancer surgery, people frequently sit with rounded shoulders posture because it puts their incisions on slack and feels better. If you sit like  this and scar tissue forms in that position, you can become very tight and have pain sitting or standing with good posture. Try to be aware of your posture and sit and stand up tall to heal properly.  Follow up PT: It is recommended you return every 3 months for the first 3 years following surgery to be assessed on the SOZO machine for an L-Dex score. This helps prevent clinically significant lymphedema in 95% of patients. These follow up screens are 10 minute appointments that you are not billed for.  Grayce JINNY Sheldon, PT 09/02/2023, 2:54 PM

## 2023-09-02 ENCOUNTER — Ambulatory Visit: Attending: General Surgery

## 2023-09-02 DIAGNOSIS — M542 Cervicalgia: Secondary | ICD-10-CM | POA: Diagnosis present

## 2023-09-02 DIAGNOSIS — Z170 Estrogen receptor positive status [ER+]: Secondary | ICD-10-CM | POA: Diagnosis present

## 2023-09-02 DIAGNOSIS — C50412 Malignant neoplasm of upper-outer quadrant of left female breast: Secondary | ICD-10-CM | POA: Insufficient documentation

## 2023-09-02 NOTE — Patient Instructions (Signed)
 [facility]  [address], [address]  [place] [address]  [phone]  After Breast Cancer Class Video It is recommended you view the ABC class video to be educated on lymphedema risk reduction. This video lasts for about 30 minutes. It can be viewed on our website here: [email or web address]  Scar massage You can begin gentle scar massage to you incision sites. Gently place one hand on the incision and move the skin (without sliding on the skin) in various directions. Do this for a few minutes and then you can gently massage either coconut oil or vitamin E cream into the scars.  Compression garment You should continue wearing your compression bra until you feel like you no longer have swelling.  Home exercise Program Continue doing the exercises you were given until you feel like you can do them without feeling any tightness at the end.   Walking Program Studies show that 30 minutes of walking per day (fast enough to elevate your heart rate) can significantly reduce the risk of a cancer recurrence. If you can't walk due to other medical reasons, we encourage you to find another activity you could do (like a stationary bike or water exercise).  Posture After breast cancer surgery, people frequently sit with rounded shoulders posture because it puts their incisions on slack and feels better. If you sit like this and scar tissue forms in that position, you can become very tight and have pain sitting or standing with good posture. Try to be aware of your posture and sit and stand up tall to heal properly.  Follow up PT: It is recommended you return every 3 months for the first 3 years following surgery to be assessed on the SOZO machine for an L-Dex score. This helps prevent clinically significant lymphedema in 95% of patients. These follow up screens are 10 minute appointments that you are not billed  for.  Ashlee Pena, PT 09/02/2023, 1:55 PM

## 2023-09-11 ENCOUNTER — Telehealth: Payer: Self-pay

## 2023-09-11 NOTE — Telephone Encounter (Signed)
 Left message to confirm appt for 8/7

## 2023-09-12 ENCOUNTER — Inpatient Hospital Stay: Attending: Hematology and Oncology | Admitting: Hematology and Oncology

## 2023-09-12 VITALS — BP 111/75 | HR 68 | Temp 98.3°F | Resp 20 | Wt 127.6 lb

## 2023-09-12 DIAGNOSIS — Z170 Estrogen receptor positive status [ER+]: Secondary | ICD-10-CM | POA: Diagnosis not present

## 2023-09-12 DIAGNOSIS — Z823 Family history of stroke: Secondary | ICD-10-CM | POA: Diagnosis not present

## 2023-09-12 DIAGNOSIS — Z79899 Other long term (current) drug therapy: Secondary | ICD-10-CM | POA: Insufficient documentation

## 2023-09-12 DIAGNOSIS — Z803 Family history of malignant neoplasm of breast: Secondary | ICD-10-CM | POA: Diagnosis not present

## 2023-09-12 DIAGNOSIS — Z808 Family history of malignant neoplasm of other organs or systems: Secondary | ICD-10-CM | POA: Insufficient documentation

## 2023-09-12 DIAGNOSIS — C50412 Malignant neoplasm of upper-outer quadrant of left female breast: Secondary | ICD-10-CM | POA: Insufficient documentation

## 2023-09-12 DIAGNOSIS — Z8249 Family history of ischemic heart disease and other diseases of the circulatory system: Secondary | ICD-10-CM | POA: Insufficient documentation

## 2023-09-12 DIAGNOSIS — Z17411 Hormone receptor positive with human epidermal growth factor receptor 2 negative status: Secondary | ICD-10-CM | POA: Insufficient documentation

## 2023-09-12 DIAGNOSIS — Z818 Family history of other mental and behavioral disorders: Secondary | ICD-10-CM | POA: Insufficient documentation

## 2023-09-12 DIAGNOSIS — N6325 Unspecified lump in the left breast, overlapping quadrants: Secondary | ICD-10-CM | POA: Insufficient documentation

## 2023-09-12 NOTE — Progress Notes (Signed)
  [facility] Center CONSULT NOTE  Patient Care Team: Tonita Fallow, MD as PCP - General (Internal Medicine) Tyree Nanetta SAILOR, RN as Oncology Nurse Navigator Glean, Stephane BROCKS, RN (Inactive) as Oncology Nurse Navigator Ebbie Cough, MD as Consulting Physician (General Surgery) Loretha Ash, MD as Consulting Physician (Hematology and Oncology) Dewey Rush, MD as Consulting Physician (Radiation Oncology)  CHIEF COMPLAINTS/PURPOSE OF CONSULTATION:  Newly diagnosed breast cancer  HISTORY OF PRESENTING ILLNESS:  Ashlee Pena 43 y.o. female is here because of recent diagnosis of left breast cancer  I reviewed her records extensively and collaborated the history with the patient.  SUMMARY OF ONCOLOGIC HISTORY: Oncology History  Malignant neoplasm of upper-outer quadrant of left breast in female, estrogen receptor positive (HCC)  05/15/2023 Mammogram   Suspicious 8 mm mass in the 12 o'clock region the left breast. Targeted ultrasound is performed, showing a hypoechoic mass with angular margins in the left breast at 12 o'clock 2 cm from the nipple measuring 7 x 7 x 8 mm. Sonographic evaluation of the left axilla does not show any enlarged adenopathy.   05/16/2023 Pathology Results   Left breast needle core biopsy at 12 0 clock 2 cmfn showed IDC grade 2, ER 90% pos strong staining intensity, PR 70% positive, moderate-strong staining intensity, Her 2 1+, Ki 67 1 %   05/27/2023 Initial Diagnosis   Malignant neoplasm of upper-outer quadrant of left breast in female, estrogen receptor positive (HCC)   05/29/2023 Cancer Staging   Staging form: Breast, AJCC 8th Edition - Clinical stage from 05/29/2023: Stage IA (cT1c, cN0, cM0, G2, ER+, PR+, HER2-) - Signed by Loretha Ash, MD on 05/29/2023 Stage prefix: Initial diagnosis Histologic grading system: 3 grade system   06/10/2023 Genetic Testing   Negative [facility] CustomNext-Cancer +RNAinsight Panel.  Report date is 06/10/2023.   The  [facility] CustomNext-Cancer +RNAinsight Panel (CancerNext + melanoma genes) includes sequencing, deletion/duplication, and RNA analysis for the following 44 genes:  APC, ATM, BAP1, BARD1, BMPR1A, BRCA1, BRCA2, BRIP1, CDH1, CDK4, CDKN2A, CHEK2, FH, FLCN, MET, MLH1, MSH2, MSH6, MUTYH, NF1, NTHL1, PALB2, PMS2, POT1, PTEN, RAD51C, RAD51D, RB1, RPS20, SMAD4, STK11, TP53, TSC1, TSC2 and VHL (sequencing and deletion/duplication); AXIN2, HOXB13, MBD4, MITF, MSH3, POLD1 and POLE (sequencing only); EPCAM and GREM1 (deletion/duplication only).     Discussed the use of AI scribe software for clinical note transcription with the patient, who gave verbal consent to proceed.  History of Present Illness Ashlee Pena is a 43 year old female with invasive ductal carcinoma of the left breast who presents for oncology consultation.  She has invasive ductal carcinoma of the left breast at the twelve o'clock position, measuring approximately 8x7x7 millimeters. The cancer is estrogen receptor-positive and progesterone receptor-positive, with a low proliferation index (KI-67 of 1%). The tumor is classified as grade 2, indicating intermediate aggressiveness. She is premenopausal and has not undergone menopause. Lymph nodes appeared normal on ultrasound.  She has a family history of breast cancer on her paternal side, with her father's aunt having had breast cancer. Her grandfather had melanoma. There is no known history of ovarian, pancreatic, or prostate cancer in the family.  She describes her lifestyle as high-stress due to self-employment and inconsistent income, which she believes contributes to her health issues. She has recently made dietary changes, eliminating sugar, meats, and proteins, and is working on managing stress better. She has no children and has used birth control, which she acknowledges as a risk factor for breast cancer.  She has no significant past medical history  of heart problems, although her father  died suddenly of undiagnosed heart disease at 49. No smoking or diabetes. She reports no significant symptoms prior to the biopsy, but notes pain in the breast since the procedure.   MEDICAL HISTORY:  Past Medical History:  Diagnosis Date   Breast cancer (HCC)    Chronic fatigue    Migraines     SURGICAL HISTORY: Past Surgical History:  Procedure Laterality Date   BREAST BIOPSY Left 05/16/2023   US  LT BREAST BX W LOC DEV 1ST LESION IMG BX SPEC US  GUIDE 05/16/2023 [facility]   BREAST BIOPSY Right 06/11/2023   US  RT BREAST BX W LOC DEV 1ST LESION IMG BX SPEC US  GUIDE 06/11/2023 [facility]   BREAST BIOPSY Left 08/08/2023   US  LT RADIOACTIVE SEED LOC 08/08/2023 [facility]   BREAST LUMPECTOMY Left 08/26/2023   Procedure: BREAST LUMPECTOMY RE-Exsision;  Surgeon: Ebbie Cough, MD;  Location: [facility];  Service: General;  Laterality: Left;  LEFT BREAST RE-EXCISION LUMPECTOMY   BREAST LUMPECTOMY WITH RADIOACTIVE SEED AND SENTINEL LYMPH NODE BIOPSY Left 08/12/2023   Procedure: BREAST LUMPECTOMY WITH RADIOACTIVE SEED AND SENTINEL LYMPH NODE BIOPSY;  Surgeon: Ebbie Cough, MD;  Location: [facility];  Service: General;  Laterality: Left;  LEFT BREAST SEED GUIDED LUMPECTOMY LEFT AXILLARY SENTINEL LYMPH NODE BIOPSY   CLOSED REDUCTION CLAVICLE FRACTURE Right 2011    SOCIAL HISTORY: Social History   Socioeconomic History   Marital status: Single    Spouse name: Not on file   Number of children: Not on file   Years of education: Not on file   Highest education level: Not on file  Occupational History   Not on file  Tobacco Use   Smoking status: Never   Smokeless tobacco: Never  Vaping Use   Vaping status: Never Used  Substance and Sexual Activity   Alcohol use: Yes    Alcohol/week: 1.0 standard drink of alcohol    Types: 1 Standard drinks or equivalent per week   Drug use: No   Sexual activity: Yes    Partners: Male    Birth  control/protection: Inserts  Other Topics Concern   Not on file  Social History Narrative   Not on file   Social Drivers of Health   Financial Resource Strain: Not on file  Food Insecurity: Food Insecurity Present (05/29/2023)   Hunger Vital Sign    Worried About Running Out of Food in the Last Year: Sometimes true    Ran Out of Food in the Last Year: Sometimes true  Transportation Needs: No Transportation Needs (05/29/2023)   PRAPARE - Administrator, Civil Service (Medical): No    Lack of Transportation (Non-Medical): No  Physical Activity: Not on file  Stress: Not on file  Social Connections: Unknown (06/20/2021)   Received from [facility]   Social Network    Social Network: Not on file  Intimate Partner Violence: Not At Risk (05/29/2023)   Humiliation, Afraid, Rape, and Kick questionnaire    Fear of Current or Ex-Partner: No    Emotionally Abused: No    Physically Abused: No    Sexually Abused: No    FAMILY HISTORY: Family History  Problem Relation Age of Onset   Heart disease Father 55       died   Chronic fatigue Father    Stomach cancer Maternal Uncle        dx >50   Dementia Maternal Grandfather  Skin cancer Paternal Grandfather        melanoma   Hypertension Paternal Grandfather    Stroke Paternal Grandfather    Heart attack Paternal Grandfather    Alzheimer's disease Paternal Great-grandfather    Breast cancer Other 50       PGF's sister    ALLERGIES:  is allergic to other.  MEDICATIONS:  Current Outpatient Medications  Medication Sig Dispense Refill   Ascorbic Acid (VITAMIN C) 1000 MG tablet Take 1,000 mg by mouth daily.     Cholecalciferol (VITAMIN D3) 250 MCG (10000 UT) TABS Take by mouth daily.     Ibuprofen 200 MG CAPS Take 200 mg by mouth as needed.     EPINEPHrine  0.3 mg/0.3 mL IJ SOAJ injection Inject 0.3 mg into the muscle as needed for anaphylaxis. (Patient not taking: Reported on 09/12/2023)     traMADol  (ULTRAM ) 50 MG tablet  Take 1 tablet (50 mg total) by mouth every 6 (six) hours as needed. (Patient not taking: Reported on 09/12/2023) 10 tablet 0   No current facility-administered medications for this visit.    REVIEW OF SYSTEMS:   Constitutional: Denies fevers, chills or abnormal night sweats Eyes: Denies blurriness of vision, double vision or watery eyes Ears, nose, mouth, throat, and face: Denies mucositis or sore throat Respiratory: Denies cough, dyspnea or wheezes Cardiovascular: Denies palpitation, chest discomfort or lower extremity swelling Gastrointestinal:  Denies nausea, heartburn or change in bowel habits Skin: Denies abnormal skin rashes Lymphatics: Denies new lymphadenopathy or easy bruising Neurological:Denies numbness, tingling or new weaknesses Behavioral/Psych: Mood is stable, no new changes  Breast: Denies any palpable lumps or discharge All other systems were reviewed with the patient and are negative.  PHYSICAL EXAMINATION: ECOG PERFORMANCE STATUS: 0 - Asymptomatic  Vitals:   09/12/23 1100  BP: 111/75  Pulse: 68  Resp: 20  Temp: 98.3 F (36.8 C)  SpO2: 99%   Filed Weights   09/12/23 1100  Weight: 127 lb 9.6 oz (57.9 kg)    GENERAL:alert, no distress and comfortable BREAST:post biopsy changes left breast. No other palpable masses. No regional adenopathy  LABORATORY DATA:  I have reviewed the data as listed Lab Results  Component Value Date   WBC 6.3 05/29/2023   HGB 14.5 05/29/2023   HCT 40.9 05/29/2023   MCV 90.7 05/29/2023   PLT 158 05/29/2023   Lab Results  Component Value Date   NA 139 05/29/2023   K 4.0 05/29/2023   CL 106 05/29/2023   CO2 27 05/29/2023    RADIOGRAPHIC STUDIES: I have personally reviewed the radiological reports and agreed with the findings in the report.  ASSESSMENT AND PLAN:   No problem-specific Assessment & Plan notes found for this encounter.   All questions were answered. The patient knows to call the clinic with any problems,  questions or concerns.    Amber Stalls, MD 09/12/23

## 2023-09-12 NOTE — Assessment & Plan Note (Signed)
 This is a very pleasant 42 yr pre menopausal female patient with ER pos, PR pos, Her 2 neg BC here for follow up after surgery.  Assessment and Plan Assessment & Plan Early-stage breast cancer, status post lumpectomy and lymph node biopsy Early-stage breast cancer with favorable characteristics, post-lumpectomy and lymph node biopsy with negative margins and four negative lymph nodes. APBI not recommended due to increased skin toxicity in younger patients but this is up to her and Dr Dewey. I encouraged her to discuss with Dr Dewey. Discussed tamoxifen as systemic therapy to reduce recurrence risk.  - Consider adj radiation - Discuss concerns and options with radiation oncologist on August 19th. - Consider tamoxifen therapy post-radiation, with a trial period of three months to assess side effects. - Continue annual diagnostic mammograms for surveillance.  She wants to think about this. Will FU with telephone visit.

## 2023-09-12 NOTE — Progress Notes (Signed)
 [facility] CONSULT NOTE  Patient Care Team: Tonita Fallow, MD as PCP - General (Internal Medicine) Tyree Nanetta SAILOR, RN as Oncology Nurse Navigator Glean, Stephane BROCKS, RN (Inactive) as Oncology Nurse Navigator Ebbie Cough, MD as Consulting Physician (General Surgery) Loretha Ash, MD as Consulting Physician (Hematology and Oncology) Dewey Rush, MD as Consulting Physician (Radiation Oncology)  CHIEF COMPLAINTS/PURPOSE OF CONSULTATION:  Newly diagnosed breast cancer  HISTORY OF PRESENTING ILLNESS:  Ashlee Pena 43 y.o. female is here because of recent diagnosis of left breast cancer  I reviewed her records extensively and collaborated the history with the patient.  SUMMARY OF ONCOLOGIC HISTORY: Oncology History  Malignant neoplasm of upper-outer quadrant of left breast in female, estrogen receptor positive (HCC)  05/15/2023 Mammogram   Suspicious 8 mm mass in the 12 o'clock region the left breast. Targeted ultrasound is performed, showing a hypoechoic mass with angular margins in the left breast at 12 o'clock 2 cm from the nipple measuring 7 x 7 x 8 mm. Sonographic evaluation of the left axilla does not show any enlarged adenopathy.   05/16/2023 Pathology Results   Left breast needle core biopsy at 12 0 clock 2 cmfn showed IDC grade 2, ER 90% pos strong staining intensity, PR 70% positive, moderate-strong staining intensity, Her 2 1+, Ki 67 1 %   05/27/2023 Initial Diagnosis   Malignant neoplasm of upper-outer quadrant of left breast in female, estrogen receptor positive (HCC)   05/29/2023 Cancer Staging   Staging form: Breast, AJCC 8th Edition - Clinical stage from 05/29/2023: Stage IA (cT1c, cN0, cM0, G2, ER+, PR+, HER2-) - Signed by Loretha Ash, MD on 05/29/2023 Stage prefix: Initial diagnosis Histologic grading system: 3 grade system   06/10/2023 Genetic Testing   Negative [employer] CustomNext-Cancer +RNAinsight Panel.  Report date is 06/10/2023.   The  [employer] CustomNext-Cancer +RNAinsight Panel (CancerNext + melanoma genes) includes sequencing, deletion/duplication, and RNA analysis for the following 44 genes:  APC, ATM, BAP1, BARD1, BMPR1A, BRCA1, BRCA2, BRIP1, CDH1, CDK4, CDKN2A, CHEK2, FH, FLCN, MET, MLH1, MSH2, MSH6, MUTYH, NF1, NTHL1, PALB2, PMS2, POT1, PTEN, RAD51C, RAD51D, RB1, RPS20, SMAD4, STK11, TP53, TSC1, TSC2 and VHL (sequencing and deletion/duplication); AXIN2, HOXB13, MBD4, MITF, MSH3, POLD1 and POLE (sequencing only); EPCAM and GREM1 (deletion/duplication only).     Discussed the use of AI scribe software for clinical note transcription with the patient, who gave verbal consent to proceed.  History of Present Illness  Ashlee Pena is a 43 year old female with breast cancer who presents for follow-up after surgery and to discuss radiation therapy options.  She recently underwent a second surgery for breast cancer, and reports that the margins were better and the findings were benign according to her understanding. The lymph node biopsy was more invasive than expected, resulting in the removal of four lymph nodes. 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  She is concerned about the upcoming radiation therapy, particularly regarding skin toxicity and treatment duration. She is interested in exploring accelerated partial breast irradiation (APBI) as an alternative to the standard four to five weeks of radiation due to apprehension about long-term skin effects.  She is hesitant to take tamoxifen, worried about systemic disruption and side effects reported by friends, although she acknowledges its potential benefits in reducing breast cancer recurrence risk.  She completed a 17-day prolonged water fast under medical supervision, which she describes as transformative, improving her mental, spiritual, and emotional well-being, though it did not reduce her tumor  size.  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   MEDICAL HISTORY:  Past Medical History:  Diagnosis Date   Breast cancer (HCC)    Chronic fatigue    Migraines     SURGICAL HISTORY: Past Surgical History:  Procedure Laterality Date   BREAST BIOPSY Left 05/16/2023   US  LT BREAST BX W LOC DEV 1ST LESION IMG BX SPEC US  GUIDE 05/16/2023 [facility]   BREAST BIOPSY Right 06/11/2023   US  RT BREAST BX W LOC DEV 1ST LESION IMG BX SPEC US  GUIDE 06/11/2023 [facility]   BREAST BIOPSY Left 08/08/2023   US  LT RADIOACTIVE SEED LOC 08/08/2023 [facility]   BREAST LUMPECTOMY Left 08/26/2023   Procedure: BREAST LUMPECTOMY RE-Exsision;  Surgeon: Ebbie Cough, MD;  Location: [facility];  Service: General;  Laterality: Left;  LEFT BREAST RE-EXCISION LUMPECTOMY   BREAST LUMPECTOMY WITH RADIOACTIVE SEED AND SENTINEL LYMPH NODE BIOPSY Left 08/12/2023   Procedure: BREAST LUMPECTOMY WITH RADIOACTIVE SEED AND SENTINEL LYMPH NODE BIOPSY;  Surgeon: Ebbie Cough, MD;  Location: [facility];  Service: General;  Laterality: Left;  LEFT BREAST SEED GUIDED LUMPECTOMY LEFT AXILLARY SENTINEL LYMPH NODE BIOPSY   CLOSED REDUCTION CLAVICLE FRACTURE Right 2011    SOCIAL HISTORY: Social History   Socioeconomic History   Marital status: Single    Spouse name: Not on file   Number of children: Not on file   Years of education: Not on file   Highest education level: Not on file  Occupational History   Not on file  Tobacco Use   Smoking status: Never   Smokeless tobacco: Never  Vaping Use   Vaping status: Never Used  Substance and Sexual Activity   Alcohol use: Yes    Alcohol/week: 1.0 standard drink of alcohol    Types: 1 Standard drinks or equivalent per week   Drug use: No   Sexual activity: Yes    Partners: Male    Birth control/protection: Inserts   Other Topics Concern   Not on file  Social History Narrative   Not on file   Social Drivers of Health   Financial Resource Strain: Not on file  Food Insecurity: Food Insecurity Present (05/29/2023)   Hunger Vital Sign    Worried About Running Out of Food in the Last Year: Sometimes true    Ran Out of Food in the Last Year: Sometimes true  Transportation Needs: No Transportation Needs (05/29/2023)   PRAPARE - Administrator, Civil Service (Medical): No    Lack of Transportation (Non-Medical): No  Physical Activity: Not on file  Stress: Not on file  Social Connections: Unknown (06/20/2021)   Received from [facility]   Social Network    Social Network: Not on file  Intimate Partner Violence: Not At Risk (05/29/2023)   Humiliation, Afraid, Rape, and Kick questionnaire    Fear of Current or Ex-Partner: No    Emotionally Abused: No    Physically Abused: No    Sexually Abused: No    FAMILY HISTORY: Family History  Problem Relation Age of Onset   Heart disease Father 38       died   Chronic fatigue Father    Stomach cancer Maternal Uncle        dx >50   Dementia Maternal Grandfather    Skin cancer Paternal Grandfather  melanoma   Hypertension Paternal Grandfather    Stroke Paternal Grandfather    Heart attack Paternal Grandfather    Alzheimer's disease Paternal Great-grandfather    Breast cancer Other 50       PGF's sister    ALLERGIES:  is allergic to other.  MEDICATIONS:  Current Outpatient Medications  Medication Sig Dispense Refill   Ascorbic Acid (VITAMIN C) 1000 MG tablet Take 1,000 mg by mouth daily.     Cholecalciferol (VITAMIN D3) 250 MCG (10000 UT) TABS Take by mouth daily.     Ibuprofen 200 MG CAPS Take 200 mg by mouth as needed.     EPINEPHrine  0.3 mg/0.3 mL IJ SOAJ injection Inject 0.3 mg into the muscle as needed for anaphylaxis. (Patient not taking: Reported on 09/12/2023)     traMADol  (ULTRAM ) 50 MG tablet Take 1 tablet (50 mg total)  by mouth every 6 (six) hours as needed. (Patient not taking: Reported on 09/12/2023) 10 tablet 0   No current facility-administered medications for this visit.    REVIEW OF SYSTEMS:   Constitutional: Denies fevers, chills or abnormal night sweats Eyes: Denies blurriness of vision, double vision or watery eyes Ears, nose, mouth, throat, and face: Denies mucositis or sore throat Respiratory: Denies cough, dyspnea or wheezes Cardiovascular: Denies palpitation, chest discomfort or lower extremity swelling Gastrointestinal:  Denies nausea, heartburn or change in bowel habits Skin: Denies abnormal skin rashes Lymphatics: Denies new lymphadenopathy or easy bruising Neurological:Denies numbness, tingling or new weaknesses Behavioral/Psych: Mood is stable, no new changes  Breast: Denies any palpable lumps or discharge All other systems were reviewed with the patient and are negative.  PHYSICAL EXAMINATION: ECOG PERFORMANCE STATUS: 0 - Asymptomatic  Vitals:   09/12/23 1100  BP: 111/75  Pulse: 68  Resp: 20  Temp: 98.3 F (36.8 C)  SpO2: 99%   Filed Weights   09/12/23 1100  Weight: 127 lb 9.6 oz (57.9 kg)    GENERAL:alert, no distress and comfortable   LABORATORY DATA:  I have reviewed the data as listed Lab Results  Component Value Date   WBC 6.3 05/29/2023   HGB 14.5 05/29/2023   HCT 40.9 05/29/2023   MCV 90.7 05/29/2023   PLT 158 05/29/2023   Lab Results  Component Value Date   NA 139 05/29/2023   K 4.0 05/29/2023   CL 106 05/29/2023   CO2 27 05/29/2023    RADIOGRAPHIC STUDIES: I have personally reviewed the radiological reports and agreed with the findings in the report.  ASSESSMENT AND PLAN:   Malignant neoplasm of upper-outer quadrant of left breast in female, estrogen receptor positive (HCC) This is a very pleasant 42 yr pre menopausal female patient with ER pos, PR pos, Her 2 neg BC here for follow up after surgery.  Assessment and Plan Assessment &  Plan Early-stage breast cancer, status post lumpectomy and lymph node biopsy Early-stage breast cancer with favorable characteristics, post-lumpectomy and lymph node biopsy with negative margins and four negative lymph nodes. APBI not recommended due to increased skin toxicity in younger patients but this is up to her and Dr Dewey. I encouraged her to discuss with Dr Dewey. Discussed tamoxifen as systemic therapy to reduce recurrence risk.  - Consider adj radiation - Discuss concerns and options with radiation oncologist on August 19th. - Consider tamoxifen therapy post-radiation, with a trial period of three months to assess side effects. - Continue annual diagnostic mammograms for surveillance.  She wants to think about this. Will FU with telephone  visit.        All questions were answered. The patient knows to call the clinic with any problems, questions or concerns.    Ashlee Stalls, MD 09/12/23

## 2023-09-20 ENCOUNTER — Telehealth: Payer: Self-pay | Admitting: Radiation Oncology

## 2023-09-20 NOTE — Telephone Encounter (Signed)
 8/15 Patient called to cancel all appts and do not want to r/s with RadOnc.

## 2023-09-24 ENCOUNTER — Ambulatory Visit: Admitting: Radiation Oncology

## 2023-09-24 ENCOUNTER — Ambulatory Visit

## 2023-10-02 ENCOUNTER — Encounter: Payer: Self-pay | Admitting: *Deleted

## 2023-10-10 ENCOUNTER — Inpatient Hospital Stay: Attending: Hematology and Oncology | Admitting: Hematology and Oncology

## 2023-10-10 ENCOUNTER — Encounter: Payer: Self-pay | Admitting: *Deleted

## 2023-10-10 VITALS — BP 109/79 | HR 66 | Temp 98.4°F | Resp 16 | Wt 128.8 lb

## 2023-10-10 DIAGNOSIS — Z800 Family history of malignant neoplasm of digestive organs: Secondary | ICD-10-CM | POA: Diagnosis not present

## 2023-10-10 DIAGNOSIS — Z170 Estrogen receptor positive status [ER+]: Secondary | ICD-10-CM

## 2023-10-10 DIAGNOSIS — Z818 Family history of other mental and behavioral disorders: Secondary | ICD-10-CM | POA: Diagnosis not present

## 2023-10-10 DIAGNOSIS — N6325 Unspecified lump in the left breast, overlapping quadrants: Secondary | ICD-10-CM | POA: Insufficient documentation

## 2023-10-10 DIAGNOSIS — Z803 Family history of malignant neoplasm of breast: Secondary | ICD-10-CM | POA: Insufficient documentation

## 2023-10-10 DIAGNOSIS — Z79899 Other long term (current) drug therapy: Secondary | ICD-10-CM | POA: Insufficient documentation

## 2023-10-10 DIAGNOSIS — Z8249 Family history of ischemic heart disease and other diseases of the circulatory system: Secondary | ICD-10-CM | POA: Diagnosis not present

## 2023-10-10 DIAGNOSIS — C50412 Malignant neoplasm of upper-outer quadrant of left female breast: Secondary | ICD-10-CM | POA: Insufficient documentation

## 2023-10-10 DIAGNOSIS — Z808 Family history of malignant neoplasm of other organs or systems: Secondary | ICD-10-CM | POA: Diagnosis not present

## 2023-10-10 DIAGNOSIS — Z17411 Hormone receptor positive with human epidermal growth factor receptor 2 negative status: Secondary | ICD-10-CM | POA: Diagnosis not present

## 2023-10-10 DIAGNOSIS — R5382 Chronic fatigue, unspecified: Secondary | ICD-10-CM | POA: Insufficient documentation

## 2023-10-10 DIAGNOSIS — Z823 Family history of stroke: Secondary | ICD-10-CM | POA: Diagnosis not present

## 2023-10-10 NOTE — Progress Notes (Signed)
 [facility] CONSULT NOTE  Patient Care Team: Tonita Fallow, MD (Inactive) as PCP - General (Internal Medicine) Tyree Nanetta SAILOR, RN as Oncology Nurse Navigator Glean, Stephane BROCKS, RN (Inactive) as Oncology Nurse Navigator Ebbie Cough, MD as Consulting Physician (General Surgery) Loretha Ash, MD as Consulting Physician (Hematology and Oncology) Dewey Rush, MD as Consulting Physician (Radiation Oncology)  CHIEF COMPLAINTS/PURPOSE OF CONSULTATION:  Newly diagnosed breast cancer  HISTORY OF PRESENTING ILLNESS:  Ashlee Pena 43 y.o. female is here because of recent diagnosis of left breast cancer  I reviewed her records extensively and collaborated the history with the patient.  SUMMARY OF ONCOLOGIC HISTORY: Oncology History  Malignant neoplasm of upper-outer quadrant of left breast in female, estrogen receptor positive (HCC)  05/15/2023 Mammogram   Suspicious 8 mm mass in the 12 o'clock region the left breast. Targeted ultrasound is performed, showing a hypoechoic mass with angular margins in the left breast at 12 o'clock 2 cm from the nipple measuring 7 x 7 x 8 mm. Sonographic evaluation of the left axilla does not show any enlarged adenopathy.   05/16/2023 Pathology Results   Left breast needle core biopsy at 12 0 clock 2 cmfn showed IDC grade 2, ER 90% pos strong staining intensity, PR 70% positive, moderate-strong staining intensity, Her 2 1+, Ki 67 1 %   05/27/2023 Initial Diagnosis   Malignant neoplasm of upper-outer quadrant of left breast in female, estrogen receptor positive (HCC)   05/29/2023 Cancer Staging   Staging form: Breast, AJCC 8th Edition - Clinical stage from 05/29/2023: Stage IA (cT1c, cN0, cM0, G2, ER+, PR+, HER2-) - Signed by Loretha Ash, MD on 05/29/2023 Stage prefix: Initial diagnosis Histologic grading system: 3 grade system   06/10/2023 Genetic Testing   Negative [facility] CustomNext-Cancer +RNAinsight Panel.  Report date is 06/10/2023.    The [facility] CustomNext-Cancer +RNAinsight Panel (CancerNext + melanoma genes) includes sequencing, deletion/duplication, and RNA analysis for the following 44 genes:  APC, ATM, BAP1, BARD1, BMPR1A, BRCA1, BRCA2, BRIP1, CDH1, CDK4, CDKN2A, CHEK2, FH, FLCN, MET, MLH1, MSH2, MSH6, MUTYH, NF1, NTHL1, PALB2, PMS2, POT1, PTEN, RAD51C, RAD51D, RB1, RPS20, SMAD4, STK11, TP53, TSC1, TSC2 and VHL (sequencing and deletion/duplication); AXIN2, HOXB13, MBD4, MITF, MSH3, POLD1 and POLE (sequencing only); EPCAM and GREM1 (deletion/duplication only).     Discussed the use of AI scribe software for clinical note transcription with the patient, who gave verbal consent to proceed.  History of Present Illness [name] is a 43 year old female with breast cancer who presents to discuss treatment options.  She has decided against radiation therapy and tamoxifen, driven by a strong personal conviction rather than fear. She is open to closely monitoring her condition.  Her oncotype score is zero. She is aware of the statistical risk of breast cancer recurrence with and without treatment.  She has extremely dense breasts. She is proactive about her health and has previously undertaken a 17-day water fast, indicating her commitment to maintaining a healthy lifestyle.  She is interested in participating in the Guardent Reveal blood test to monitor for breast cancer recurrence, which involves periodic blood draws to detect tumor DNA. This test is not yet FDA approved but is available to her at no charge.   MEDICAL HISTORY:  Past Medical History:  Diagnosis Date   Breast cancer (HCC)    Chronic fatigue    Migraines     SURGICAL HISTORY: Past Surgical History:  Procedure Laterality Date   BREAST BIOPSY Left 05/16/2023   US  LT  BREAST BX W LOC DEV 1ST LESION IMG BX SPEC US  GUIDE 05/16/2023 [facility]   BREAST BIOPSY Right 06/11/2023   US  RT BREAST BX W LOC DEV 1ST LESION IMG BX SPEC US  GUIDE 06/11/2023  [facility]   BREAST BIOPSY Left 08/08/2023   US  LT RADIOACTIVE SEED LOC 08/08/2023 [facility]   BREAST LUMPECTOMY Left 08/26/2023   Procedure: BREAST LUMPECTOMY RE-Exsision;  Surgeon: Ebbie Cough, MD;  Location: [facility];  Service: General;  Laterality: Left;  LEFT BREAST RE-EXCISION LUMPECTOMY   BREAST LUMPECTOMY WITH RADIOACTIVE SEED AND SENTINEL LYMPH NODE BIOPSY Left 08/12/2023   Procedure: BREAST LUMPECTOMY WITH RADIOACTIVE SEED AND SENTINEL LYMPH NODE BIOPSY;  Surgeon: Ebbie Cough, MD;  Location: [facility];  Service: General;  Laterality: Left;  LEFT BREAST SEED GUIDED LUMPECTOMY LEFT AXILLARY SENTINEL LYMPH NODE BIOPSY   CLOSED REDUCTION CLAVICLE FRACTURE Right 2011    SOCIAL HISTORY: Social History   Socioeconomic History   Marital status: Single    Spouse name: Not on file   Number of children: Not on file   Years of education: Not on file   Highest education level: Not on file  Occupational History   Not on file  Tobacco Use   Smoking status: Never   Smokeless tobacco: Never  Vaping Use   Vaping status: Never Used  Substance and Sexual Activity   Alcohol use: Yes    Alcohol/week: 1.0 standard drink of alcohol    Types: 1 Standard drinks or equivalent per week   Drug use: No   Sexual activity: Yes    Partners: Male    Birth control/protection: Inserts  Other Topics Concern   Not on file  Social History Narrative   Not on file   Social Drivers of Health   Financial Resource Strain: Not on file  Food Insecurity: Food Insecurity Present (05/29/2023)   Hunger Vital Sign    Worried About Running Out of Food in the Last Year: Sometimes true    Ran Out of Food in the Last Year: Sometimes true  Transportation Needs: No Transportation Needs (05/29/2023)   PRAPARE - Administrator, Civil Service (Medical): No    Lack of Transportation (Non-Medical): No  Physical Activity: Not on file  Stress: Not on  file  Social Connections: Unknown (06/20/2021)   Received from [facility]   Social Network    Social Network: Not on file  Intimate Partner Violence: Not At Risk (05/29/2023)   Humiliation, Afraid, Rape, and Kick questionnaire    Fear of Current or Ex-Partner: No    Emotionally Abused: No    Physically Abused: No    Sexually Abused: No    FAMILY HISTORY: Family History  Problem Relation Age of Onset   Heart disease Father 17       died   Chronic fatigue Father    Stomach cancer Maternal Uncle        dx >50   Dementia Maternal Grandfather    Skin cancer Paternal Grandfather        melanoma   Hypertension Paternal Grandfather    Stroke Paternal Grandfather    Heart attack Paternal Grandfather    Alzheimer's disease Paternal Great-grandfather    Breast cancer Other 50       PGF's sister    ALLERGIES:  is allergic to other.  MEDICATIONS:  Current Outpatient Medications  Medication Sig Dispense Refill   Ascorbic Acid (VITAMIN C) 1000 MG tablet Take 1,000  mg by mouth daily.     Cholecalciferol (VITAMIN D3) 250 MCG (10000 UT) TABS Take by mouth daily.     EPINEPHrine  0.3 mg/0.3 mL IJ SOAJ injection Inject 0.3 mg into the muscle as needed for anaphylaxis.     Ibuprofen 200 MG CAPS Take 200 mg by mouth as needed.     traMADol  (ULTRAM ) 50 MG tablet Take 1 tablet (50 mg total) by mouth every 6 (six) hours as needed. 10 tablet 0   No current facility-administered medications for this visit.    REVIEW OF SYSTEMS:   Constitutional: Denies fevers, chills or abnormal night sweats Eyes: Denies blurriness of vision, double vision or watery eyes Ears, nose, mouth, throat, and face: Denies mucositis or sore throat Respiratory: Denies cough, dyspnea or wheezes Cardiovascular: Denies palpitation, chest discomfort or lower extremity swelling Gastrointestinal:  Denies nausea, heartburn or change in bowel habits Skin: Denies abnormal skin rashes Lymphatics: Denies new lymphadenopathy or  easy bruising Neurological:Denies numbness, tingling or new weaknesses Behavioral/Psych: Mood is stable, no new changes  Breast: Denies any palpable lumps or discharge All other systems were reviewed with the patient and are negative.  PHYSICAL EXAMINATION: ECOG PERFORMANCE STATUS: 0 - Asymptomatic  Vitals:   10/10/23 0936  BP: 109/79  Pulse: 66  Resp: 16  Temp: 98.4 F (36.9 C)  SpO2: 100%   Filed Weights   10/10/23 0936  Weight: 128 lb 12.8 oz (58.4 kg)    GENERAL:alert, no distress and comfortable   LABORATORY DATA:  I have reviewed the data as listed Lab Results  Component Value Date   WBC 6.3 05/29/2023   HGB 14.5 05/29/2023   HCT 40.9 05/29/2023   MCV 90.7 05/29/2023   PLT 158 05/29/2023   Lab Results  Component Value Date   NA 139 05/29/2023   K 4.0 05/29/2023   CL 106 05/29/2023   CO2 27 05/29/2023    RADIOGRAPHIC STUDIES: I have personally reviewed the radiological reports and agreed with the findings in the report.  ASSESSMENT AND PLAN:   Assessment and Plan Assessment & Plan Malignant neoplasm of upper-outer quadrant of left breast, post-surgical management and surveillance Post-surgical management of breast cancer in the upper-outer quadrant of the left breast. She declined radiation therapy and tamoxifen after informed decision-making. Oncotype score zero indicates low risk of distant recurrence with SOC treatment.  She understands that distant recurrence usually means advanced breast cancer and may not be curable. She is aware of risks and benefits and is at peace with her decision. Gardent Reveal test offered for monitoring, not FDA approved but free of charge. - Order annual diagnostic mammograms. - Order MRI of the breast in November 2025 due to extremely dense breast tissue. - Provide Guardant Reveal test pamphlet for blood test to detect breast cancer recurrence. - Coordinate mobile blood draw for Guardent Reveal test every six months. -  Communicate her decision to radiation oncology team.   All questions were answered. The patient knows to call the clinic with any problems, questions or concerns.    Amber Stalls, MD 10/10/23

## 2023-11-12 ENCOUNTER — Encounter: Payer: Self-pay | Admitting: Hematology and Oncology

## 2023-11-12 ENCOUNTER — Inpatient Hospital Stay: Attending: Hematology and Oncology | Admitting: Hematology and Oncology

## 2023-11-12 DIAGNOSIS — C50412 Malignant neoplasm of upper-outer quadrant of left female breast: Secondary | ICD-10-CM

## 2023-11-12 DIAGNOSIS — Z170 Estrogen receptor positive status [ER+]: Secondary | ICD-10-CM

## 2023-11-12 NOTE — Progress Notes (Signed)
 [facility] CONSULT NOTE  Patient Care Team: Tonita Fallow, MD (Inactive) as PCP - General (Internal Medicine) Tyree Nanetta SAILOR, RN as Oncology Nurse Navigator Ebbie Cough, MD as Consulting Physician (General Surgery) Loretha Ash, MD as Consulting Physician (Hematology and Oncology) Dewey Rush, MD as Consulting Physician (Radiation Oncology)  CHIEF COMPLAINTS/PURPOSE OF CONSULTATION:  Newly diagnosed breast cancer  HISTORY OF PRESENTING ILLNESS:  Ashlee Pena 43 y.o. female is here because of recent diagnosis of left breast cancer  I reviewed her records extensively and collaborated the history with the patient.  SUMMARY OF ONCOLOGIC HISTORY: Oncology History  Malignant neoplasm of upper-outer quadrant of left breast in female, estrogen receptor positive (HCC)  05/15/2023 Mammogram   Suspicious 8 mm mass in the 12 o'clock region the left breast. Targeted ultrasound is performed, showing a hypoechoic mass with angular margins in the left breast at 12 o'clock 2 cm from the nipple measuring 7 x 7 x 8 mm. Sonographic evaluation of the left axilla does not show any enlarged adenopathy.   05/16/2023 Pathology Results   Left breast needle core biopsy at 12 0 clock 2 cmfn showed IDC grade 2, ER 90% pos strong staining intensity, PR 70% positive, moderate-strong staining intensity, Her 2 1+, Ki 67 1 %   05/27/2023 Initial Diagnosis   Malignant neoplasm of upper-outer quadrant of left breast in female, estrogen receptor positive (HCC)   05/29/2023 Cancer Staging   Staging form: Breast, AJCC 8th Edition - Clinical stage from 05/29/2023: Stage IA (cT1c, cN0, cM0, G2, ER+, PR+, HER2-) - Signed by Loretha Ash, MD on 05/29/2023 Stage prefix: Initial diagnosis Histologic grading system: 3 grade system   06/10/2023 Genetic Testing   Negative [facility] CustomNext-Cancer +RNAinsight Panel.  Report date is 06/10/2023.   The [facility] CustomNext-Cancer +RNAinsight Panel  (CancerNext + melanoma genes) includes sequencing, deletion/duplication, and RNA analysis for the following 44 genes:  APC, ATM, BAP1, BARD1, BMPR1A, BRCA1, BRCA2, BRIP1, CDH1, CDK4, CDKN2A, CHEK2, FH, FLCN, MET, MLH1, MSH2, MSH6, MUTYH, NF1, NTHL1, PALB2, PMS2, POT1, PTEN, RAD51C, RAD51D, RB1, RPS20, SMAD4, STK11, TP53, TSC1, TSC2 and VHL (sequencing and deletion/duplication); AXIN2, HOXB13, MBD4, MITF, MSH3, POLD1 and POLE (sequencing only); EPCAM and GREM1 (deletion/duplication only).     Discussed the use of AI scribe software for clinical note transcription with the patient, who gave verbal consent to proceed.  History of Present Illness Ashlee Pena is a 43 year old female with breast cancer who is here for a telephone visit.    MEDICAL HISTORY:  Past Medical History:  Diagnosis Date   Breast cancer (HCC)    Chronic fatigue    Migraines     SURGICAL HISTORY: Past Surgical History:  Procedure Laterality Date   BREAST BIOPSY Left 05/16/2023   US  LT BREAST BX W LOC DEV 1ST LESION IMG BX SPEC US  GUIDE 05/16/2023 [facility]   BREAST BIOPSY Right 06/11/2023   US  RT BREAST BX W LOC DEV 1ST LESION IMG BX SPEC US  GUIDE 06/11/2023 [facility]   BREAST BIOPSY Left 08/08/2023   US  LT RADIOACTIVE SEED LOC 08/08/2023 [facility]   BREAST LUMPECTOMY Left 08/26/2023   Procedure: BREAST LUMPECTOMY RE-Exsision;  Surgeon: Ebbie Cough, MD;  Location: [facility];  Service: General;  Laterality: Left;  LEFT BREAST RE-EXCISION LUMPECTOMY   BREAST LUMPECTOMY WITH RADIOACTIVE SEED AND SENTINEL LYMPH NODE BIOPSY Left 08/12/2023   Procedure: BREAST LUMPECTOMY WITH RADIOACTIVE SEED AND SENTINEL LYMPH NODE BIOPSY;  Surgeon: Ebbie Cough, MD;  Location:  [facility];  Service: General;  Laterality: Left;  LEFT BREAST SEED GUIDED LUMPECTOMY LEFT AXILLARY SENTINEL LYMPH NODE BIOPSY   CLOSED REDUCTION CLAVICLE FRACTURE Right 2011    SOCIAL  HISTORY: Social History   Socioeconomic History   Marital status: Single    Spouse name: Not on file   Number of children: Not on file   Years of education: Not on file   Highest education level: Not on file  Occupational History   Not on file  Tobacco Use   Smoking status: Never   Smokeless tobacco: Never  Vaping Use   Vaping status: Never Used  Substance and Sexual Activity   Alcohol use: Yes    Alcohol/week: 1.0 standard drink of alcohol    Types: 1 Standard drinks or equivalent per week   Drug use: No   Sexual activity: Yes    Partners: Male    Birth control/protection: Inserts  Other Topics Concern   Not on file  Social History Narrative   Not on file   Social Drivers of Health   Financial Resource Strain: Not on file  Food Insecurity: Food Insecurity Present (05/29/2023)   Hunger Vital Sign    Worried About Running Out of Food in the Last Year: Sometimes true    Ran Out of Food in the Last Year: Sometimes true  Transportation Needs: No Transportation Needs (05/29/2023)   PRAPARE - Administrator, Civil Service (Medical): No    Lack of Transportation (Non-Medical): No  Physical Activity: Not on file  Stress: Not on file  Social Connections: Unknown (06/20/2021)   Received from [facility]   Social Network    Social Network: Not on file  Intimate Partner Violence: Not At Risk (05/29/2023)   Humiliation, Afraid, Rape, and Kick questionnaire    Fear of Current or Ex-Partner: No    Emotionally Abused: No    Physically Abused: No    Sexually Abused: No    FAMILY HISTORY: Family History  Problem Relation Age of Onset   Heart disease Father 43       died   Chronic fatigue Father    Stomach cancer Maternal Uncle        dx >50   Dementia Maternal Grandfather    Skin cancer Paternal Grandfather        melanoma   Hypertension Paternal Grandfather    Stroke Paternal Grandfather    Heart attack Paternal Grandfather    Alzheimer's disease Paternal  Great-grandfather    Breast cancer Other 50       PGF's sister    ALLERGIES:  is allergic to other.  MEDICATIONS:  Current Outpatient Medications  Medication Sig Dispense Refill   Ascorbic Acid (VITAMIN C) 1000 MG tablet Take 1,000 mg by mouth daily.     Cholecalciferol (VITAMIN D3) 250 MCG (10000 UT) TABS Take by mouth daily.     EPINEPHrine  0.3 mg/0.3 mL IJ SOAJ injection Inject 0.3 mg into the muscle as needed for anaphylaxis.     Ibuprofen 200 MG CAPS Take 200 mg by mouth as needed.     traMADol  (ULTRAM ) 50 MG tablet Take 1 tablet (50 mg total) by mouth every 6 (six) hours as needed. 10 tablet 0   No current facility-administered medications for this visit.    REVIEW OF SYSTEMS:   Constitutional: Denies fevers, chills or abnormal night sweats Eyes: Denies blurriness of vision, double vision or watery eyes Ears, nose, mouth, throat, and face: Denies  mucositis or sore throat Respiratory: Denies cough, dyspnea or wheezes Cardiovascular: Denies palpitation, chest discomfort or lower extremity swelling Gastrointestinal:  Denies nausea, heartburn or change in bowel habits Skin: Denies abnormal skin rashes Lymphatics: Denies new lymphadenopathy or easy bruising Neurological:Denies numbness, tingling or new weaknesses Behavioral/Psych: Mood is stable, no new changes  Breast: Denies any palpable lumps or discharge All other systems were reviewed with the patient and are negative.  PHYSICAL EXAMINATION: ECOG PERFORMANCE STATUS: 0 - Asymptomatic  There were no vitals filed for this visit.  There were no vitals filed for this visit.   GENERAL:alert, no distress and comfortable   LABORATORY DATA:  I have reviewed the data as listed Lab Results  Component Value Date   WBC 6.3 05/29/2023   HGB 14.5 05/29/2023   HCT 40.9 05/29/2023   MCV 90.7 05/29/2023   PLT 158 05/29/2023   Lab Results  Component Value Date   NA 139 05/29/2023   K 4.0 05/29/2023   CL 106 05/29/2023    CO2 27 05/29/2023    RADIOGRAPHIC STUDIES: I have personally reviewed the radiological reports and agreed with the findings in the report.  ASSESSMENT AND PLAN:   Assessment and Plan Assessment & Plan Malignant neoplasm of upper-outer quadrant of left breast, post-surgical management and surveillance Post-surgical management of breast cancer in the upper-outer quadrant of the left breast. She declined radiation therapy and tamoxifen after informed decision-making. Oncotype score zero indicates low risk of distant recurrence with SOC treatment.  She understands that distant recurrence usually means advanced breast cancer and may not be curable. She is aware of risks and benefits and is at peace with her decision. Gardent Reveal test offered for monitoring, not FDA approved but free of charge. - Order annual diagnostic mammograms. - Order MRI of the breast in November 2025 due to extremely dense breast tissue. - Guardant reveal to be scheduled.  She didn't need a telephone visit today,   All questions were answered. The patient knows to call the clinic with any problems, questions or concerns.    Amber Stalls, MD 11/12/23

## 2023-11-18 ENCOUNTER — Ambulatory Visit

## 2023-12-09 ENCOUNTER — Ambulatory Visit: Attending: General Surgery

## 2023-12-09 VITALS — Wt 131.4 lb

## 2023-12-09 DIAGNOSIS — Z483 Aftercare following surgery for neoplasm: Secondary | ICD-10-CM | POA: Insufficient documentation

## 2023-12-09 NOTE — Therapy (Signed)
 OUTPATIENT PHYSICAL THERAPY SOZO SCREENING NOTE   Patient Name: Ashlee Pena MRN: [id number] DOB:Jan 08, 1981, 43 y.o., female Today's Date: 12/09/2023  PCP: Ashlee Fallow, MD (Inactive) REFERRING PROVIDER: Ebbie Cough, MD   PT End of Session - 12/09/23 [phone]     Visit Number 2   # unchanged due to screen only   PT Start Time 0901    PT Stop Time 0908    PT Time Calculation (min) 7 min    Activity Tolerance Patient tolerated treatment well    Behavior During Therapy [facility] for tasks assessed/performed          Past Medical History:  Diagnosis Date   Breast cancer (HCC)    Chronic fatigue    Migraines    Past Surgical History:  Procedure Laterality Date   BREAST BIOPSY Left 05/16/2023   US  LT BREAST BX W LOC DEV 1ST LESION IMG BX SPEC US  GUIDE 05/16/2023 [facility]   BREAST BIOPSY Right 06/11/2023   US  RT BREAST BX W LOC DEV 1ST LESION IMG BX SPEC US  GUIDE 06/11/2023 [facility]   BREAST BIOPSY Left 08/08/2023   US  LT RADIOACTIVE SEED LOC 08/08/2023 [facility]   BREAST LUMPECTOMY Left 08/26/2023   Procedure: BREAST LUMPECTOMY RE-Exsision;  Surgeon: Ashlee Cough, MD;  Location: [facility];  Service: General;  Laterality: Left;  LEFT BREAST RE-EXCISION LUMPECTOMY   BREAST LUMPECTOMY WITH RADIOACTIVE SEED AND SENTINEL LYMPH NODE BIOPSY Left 08/12/2023   Procedure: BREAST LUMPECTOMY WITH RADIOACTIVE SEED AND SENTINEL LYMPH NODE BIOPSY;  Surgeon: Ashlee Cough, MD;  Location: [facility];  Service: General;  Laterality: Left;  LEFT BREAST SEED GUIDED LUMPECTOMY LEFT AXILLARY SENTINEL LYMPH NODE BIOPSY   CLOSED REDUCTION CLAVICLE FRACTURE Right 2011   Patient Active Problem List   Diagnosis Date Noted   Genetic testing 06/10/2023   Malignant neoplasm of upper-outer quadrant of left breast in female, estrogen receptor positive (HCC) 05/27/2023   ANA positive 09/05/2020   Vitamin D  deficiency 09/01/2020   Chronic  fatigue 09/01/2020   Depression 09/01/2020   Bee sting allergy  09/01/2020   At risk for prolonged QT interval syndrome 08/31/2019   Headache, menstrual migraine 09/01/2018    REFERRING DIAG: left breast cancer at risk for lymphedema  THERAPY DIAG:  Aftercare following surgery for neoplasm  PERTINENT HISTORY: Patient was diagnosed on 05/13/2023 with left grade 2 invasive ductal carcinoma breast cancer. It measures 8 mm and is located in the upper outer quadrant. It is ER/PR positive and HER2 negative with a Ki67 of 1%. She had a clavicle fracture in 2010 which required surgery with a plate placed. On  08/12/2023 she had a left Left Lumpectomy with SLNB, and a Re-excision for positive margin on  08/26/2023 with aspiration of a small seroma. She had 0+/4 LN's  PRECAUTIONS: left UE Lymphedema risk, None  SUBJECTIVE: Pt returns for her first 3 month L-Dex screen.   PAIN:  Are you having pain? No  SOZO SCREENING: Patient was assessed today using the SOZO machine to determine the lymphedema index score. This was compared to her baseline score. It was determined that she is within the recommended range when compared to her baseline and no further action is needed at this time. She will continue SOZO screenings. These are done every 3 months for 2 years post operatively followed by every 6 months for 2 years, and then annually.  Encouraged pt to watch ABC video link in previous PT note.   L-DEX FLOWSHEETS -  12/09/23 0900       L-DEX LYMPHEDEMA SCREENING   Measurement Type Unilateral    L-DEX MEASUREMENT EXTREMITY Upper Extremity    POSITION  Standing    DOMINANT SIDE Right    At Risk Side Left    BASELINE SCORE (UNILATERAL) 3.6    L-DEX SCORE (UNILATERAL) 1.3    VALUE CHANGE (UNILAT) -2.3            Ashlee Pena, PTA 12/09/2023, 9:09 AM

## 2023-12-23 ENCOUNTER — Encounter: Payer: Self-pay | Admitting: Hematology and Oncology

## 2023-12-26 ENCOUNTER — Ambulatory Visit
Admission: RE | Admit: 2023-12-26 | Discharge: 2023-12-26 | Disposition: A | Discharge status: Home / Self Care | Source: Ambulatory Visit | Attending: Hematology and Oncology | Admitting: Hematology and Oncology

## 2023-12-26 DIAGNOSIS — C50412 Malignant neoplasm of upper-outer quadrant of left female breast: Secondary | ICD-10-CM

## 2023-12-26 DIAGNOSIS — Z853 Personal history of malignant neoplasm of breast: Secondary | ICD-10-CM | POA: Diagnosis not present

## 2023-12-26 MED ORDER — GADOPICLENOL 0.5 MMOL/ML IV SOLN
6.0000 mL | Freq: Once | INTRAVENOUS | Status: AC | PRN
  Administered 2023-12-26: 6 mL via INTRAVENOUS

## 2023-12-27 ENCOUNTER — Other Ambulatory Visit: Payer: Self-pay | Admitting: Hematology and Oncology

## 2023-12-27 DIAGNOSIS — C50412 Malignant neoplasm of upper-outer quadrant of left female breast: Secondary | ICD-10-CM

## 2023-12-27 NOTE — Progress Notes (Signed)
 Mammogram ordered for April 2026.  Ashlee Pena

## 2024-01-08 ENCOUNTER — Encounter: Admitting: Adult Health

## 2024-01-17 ENCOUNTER — Telehealth: Payer: Self-pay

## 2024-01-17 NOTE — Telephone Encounter (Signed)
 Patient requested to change the in-person visit to a video appointment.

## 2024-01-21 ENCOUNTER — Inpatient Hospital Stay: Attending: Hematology and Oncology | Admitting: Adult Health

## 2024-01-21 DIAGNOSIS — Z808 Family history of malignant neoplasm of other organs or systems: Secondary | ICD-10-CM | POA: Diagnosis not present

## 2024-01-21 DIAGNOSIS — Z818 Family history of other mental and behavioral disorders: Secondary | ICD-10-CM | POA: Diagnosis not present

## 2024-01-21 DIAGNOSIS — Z79899 Other long term (current) drug therapy: Secondary | ICD-10-CM | POA: Diagnosis not present

## 2024-01-21 DIAGNOSIS — Z91030 Bee allergy status: Secondary | ICD-10-CM | POA: Diagnosis not present

## 2024-01-21 DIAGNOSIS — Z800 Family history of malignant neoplasm of digestive organs: Secondary | ICD-10-CM | POA: Diagnosis not present

## 2024-01-21 DIAGNOSIS — Z1732 Human epidermal growth factor receptor 2 negative status: Secondary | ICD-10-CM | POA: Insufficient documentation

## 2024-01-21 DIAGNOSIS — Z8249 Family history of ischemic heart disease and other diseases of the circulatory system: Secondary | ICD-10-CM | POA: Insufficient documentation

## 2024-01-21 DIAGNOSIS — R5382 Chronic fatigue, unspecified: Secondary | ICD-10-CM | POA: Insufficient documentation

## 2024-01-21 DIAGNOSIS — N6325 Unspecified lump in the left breast, overlapping quadrants: Secondary | ICD-10-CM | POA: Diagnosis not present

## 2024-01-21 DIAGNOSIS — Z85828 Personal history of other malignant neoplasm of skin: Secondary | ICD-10-CM | POA: Insufficient documentation

## 2024-01-21 DIAGNOSIS — Z803 Family history of malignant neoplasm of breast: Secondary | ICD-10-CM | POA: Insufficient documentation

## 2024-01-21 DIAGNOSIS — Z823 Family history of stroke: Secondary | ICD-10-CM | POA: Diagnosis not present

## 2024-01-21 DIAGNOSIS — C50412 Malignant neoplasm of upper-outer quadrant of left female breast: Secondary | ICD-10-CM | POA: Insufficient documentation

## 2024-01-21 DIAGNOSIS — Z170 Estrogen receptor positive status [ER+]: Secondary | ICD-10-CM | POA: Insufficient documentation

## 2024-01-21 NOTE — Progress Notes (Unsigned)
 SURVIVORSHIP VISIT: I connected with Ashlee Pena on 01/23/2024 at 11:00 AM EST by [email or web address] and verified that I am speaking with the correct person using two identifiers. I discussed the limitations, risks, security and privacy concerns of performing an evaluation and management service by telephone and the availability of in person appointments. I also discussed with the patient that there may be a patient responsible charge related to this service. The patient expressed understanding and agreed to proceed.   Patient location: home Provider location:  [facility] office Others participating in call: none  BRIEF ONCOLOGIC HISTORY:  Oncology History  Malignant neoplasm of upper-outer quadrant of left breast in female, estrogen receptor positive (HCC)  05/15/2023 Mammogram   Suspicious 8 mm mass in the 12 o'clock region the left breast. Targeted ultrasound is performed, showing a hypoechoic mass with angular margins in the left breast at 12 o'clock 2 cm from the nipple measuring 7 x 7 x 8 mm. Sonographic evaluation of the left axilla does not show any enlarged adenopathy.   05/16/2023 Pathology Results   Left breast needle core biopsy at 12 0 clock 2 cmfn showed IDC grade 2, ER 90% pos strong staining intensity, PR 70% positive, moderate-strong staining intensity, Her 2 1+, Ki 67 1 %   05/27/2023 Initial Diagnosis   Malignant neoplasm of upper-outer quadrant of left breast in female, estrogen receptor positive (HCC)   05/29/2023 Cancer Staging   Staging form: Breast, AJCC 8th Edition - Clinical stage from 05/29/2023: Stage IA (cT1c, cN0, cM0, G2, ER+, PR+, HER2-) - Signed by Ashlee Ash, MD on 05/29/2023 Stage prefix: Initial diagnosis Histologic grading system: 3 grade system   06/10/2023 Genetic Testing   Negative [facility] CustomNext-Cancer +RNAinsight Panel.  Report date is 06/10/2023.   The [facility] CustomNext-Cancer +RNAinsight Panel (CancerNext + melanoma genes) includes sequencing,  deletion/duplication, and RNA analysis for the following 44 genes:  APC, ATM, BAP1, BARD1, BMPR1A, BRCA1, BRCA2, BRIP1, CDH1, CDK4, CDKN2A, CHEK2, FH, FLCN, MET, MLH1, MSH2, MSH6, MUTYH, NF1, NTHL1, PALB2, PMS2, POT1, PTEN, RAD51C, RAD51D, RB1, RPS20, SMAD4, STK11, TP53, TSC1, TSC2 and VHL (sequencing and deletion/duplication); AXIN2, HOXB13, MBD4, MITF, MSH3, POLD1 and POLE (sequencing only); EPCAM and GREM1 (deletion/duplication only).      INTERVAL HISTORY:  Ashlee Pena to review her survivorship care plan detailing her treatment course for breast cancer, as well as monitoring long-term side effects of that treatment, education regarding health maintenance, screening, and overall wellness and health promotion.     Overall, Ashlee Pena reports feeling quite well.  She denies any difficulty with healing.  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    REVIEW OF SYSTEMS:  Review of Systems  Constitutional:  Negative for appetite change, chills, fatigue, fever and unexpected weight change.  HENT:   Negative for hearing loss, lump/mass and trouble swallowing.   Eyes:  Negative for eye problems and icterus.  Respiratory:  Negative for chest tightness, Pena and shortness of breath.   Cardiovascular:  Negative for chest pain, leg swelling and palpitations.  Gastrointestinal:  Negative for abdominal distention, abdominal pain, constipation, diarrhea, nausea and vomiting.  Endocrine: Negative for hot flashes.  Genitourinary:  Negative for difficulty urinating.   Musculoskeletal:  Negative for arthralgias.  Skin:  Negative for itching and rash.  Neurological:  Negative for dizziness, extremity weakness, headaches and numbness.  Hematological:  Negative for adenopathy. Does not bruise/bleed easily.  Psychiatric/Behavioral:  Negative for depression. The  patient is not nervous/anxious.   Breast: Denies any new nodularity, masses,  tenderness, nipple changes, or nipple discharge.    PAST MEDICAL/SURGICAL HISTORY:  Past Medical History:  Diagnosis Date   Breast cancer (HCC)    Chronic fatigue    Migraines    Past Surgical History:  Procedure Laterality Date   BREAST BIOPSY Left 05/16/2023   US  LT BREAST BX W LOC DEV 1ST LESION IMG BX SPEC US  GUIDE 05/16/2023 [facility]   BREAST BIOPSY Right 06/11/2023   US  RT BREAST BX W LOC DEV 1ST LESION IMG BX SPEC US  GUIDE 06/11/2023 [facility]   BREAST BIOPSY Left 08/08/2023   US  LT RADIOACTIVE SEED LOC 08/08/2023 [facility]   BREAST LUMPECTOMY Left 08/26/2023   Procedure: BREAST LUMPECTOMY RE-Exsision;  Surgeon: Ashlee Cough, MD;  Location: [facility];  Service: General;  Laterality: Left;  LEFT BREAST RE-EXCISION LUMPECTOMY   BREAST LUMPECTOMY WITH RADIOACTIVE SEED AND SENTINEL LYMPH NODE BIOPSY Left 08/12/2023   Procedure: BREAST LUMPECTOMY WITH RADIOACTIVE SEED AND SENTINEL LYMPH NODE BIOPSY;  Surgeon: Ashlee Cough, MD;  Location: [facility];  Service: General;  Laterality: Left;  LEFT BREAST SEED GUIDED LUMPECTOMY LEFT AXILLARY SENTINEL LYMPH NODE BIOPSY   CLOSED REDUCTION CLAVICLE FRACTURE Right 2011     ALLERGIES:  Allergies[1]   CURRENT MEDICATIONS:  Outpatient Encounter Medications as of 01/21/2024  Medication Sig Note   Ascorbic Acid (VITAMIN C) 1000 MG tablet Take 1,000 mg by mouth daily.    Cholecalciferol (VITAMIN D3) 250 MCG (10000 UT) TABS Take by mouth daily.    EPINEPHrine  0.3 mg/0.3 mL IJ SOAJ injection Inject 0.3 mg into the muscle as needed for anaphylaxis.    Ibuprofen 200 MG CAPS Take 200 mg by mouth as needed. 08/30/2016: As needed   traMADol  (ULTRAM ) 50 MG tablet Take 1 tablet (50 mg total) by mouth every 6 (six) hours as needed.    No facility-administered encounter medications on file as of 01/21/2024.     ONCOLOGIC FAMILY HISTORY:  Family History  Problem Relation Age of Onset    Heart disease Father 64       died   Chronic fatigue Father    Stomach cancer Maternal Uncle        dx >50   Dementia Maternal Grandfather    Skin cancer Paternal Grandfather        melanoma   Hypertension Paternal Grandfather    Stroke Paternal Grandfather    Heart attack Paternal Grandfather    Alzheimer's disease Paternal Great-grandfather    Breast cancer Other 50       PGF's sister     SOCIAL HISTORY:  Social History   Socioeconomic History   Marital status: Single    Spouse name: Not on file   Number of children: Not on file   Years of education: Not on file   Highest education level: Not on file  Occupational History   Not on file  Tobacco Use   Smoking status: Never   Smokeless tobacco: Never  Vaping Use   Vaping status: Never Used  Substance and Sexual Activity   Alcohol use: Yes    Alcohol/week: 1.0 standard drink of alcohol    Types: 1 Standard drinks or equivalent per week   Drug use: No   Sexual activity: Yes    Partners: Male    Birth control/protection: Inserts  Other Topics Concern   Not on file  Social History Narrative   Not  on file   Social Drivers of Health   Tobacco Use: Low Risk  (09/10/2023)   Received from [facility]   Patient History    Passive Exposure: Not on file    Smokeless Tobacco Use: Never    Smoking Tobacco Use: Never  Financial Resource Strain: Not on file  Food Insecurity: Food Insecurity Present (05/29/2023)   Hunger Vital Sign    Worried About Running Out of Food in the Last Year: Sometimes true    Ran Out of Food in the Last Year: Sometimes true  Transportation Needs: No Transportation Needs (05/29/2023)   PRAPARE - Administrator, Civil Service (Medical): No    Lack of Transportation (Non-Medical): No  Physical Activity: Not on file  Stress: Not on file  Social Connections: Not on file  Intimate Partner Violence: Not At Risk (05/29/2023)   Humiliation, Afraid, Rape, and Kick  questionnaire    Fear of Current or Ex-Partner: No    Emotionally Abused: No    Physically Abused: No    Sexually Abused: No  Depression (PHQ2-9): Low Risk (05/29/2023)   Depression (PHQ2-9)    PHQ-2 Score: 1  Alcohol Screen: Not on file  Housing: Unknown (08/07/2023)   Received from [facility]   Epic    Unable to Pay for Housing in the Last Year: Not on file    Number of Times Moved in the Last Year: Not on file    At any time in the past 12 months, were you homeless or living in a shelter (including now)?: No  Recent Concern: Housing - High Risk (05/29/2023)   Housing Stability Vital Sign    Unable to Pay for Housing in the Last Year: Yes    Number of Times Moved in the Last Year: 0    Homeless in the Last Year: No  Utilities: Not At Risk (05/29/2023)   [facility] Utilities    Threatened with loss of utilities: No  Health Literacy: Not on file     OBSERVATIONS/OBJECTIVE:  Patient appears well.  She is in no apparent distress.  Mood and behavior are normal. Speech is normal.     LABORATORY DATA:  None for this visit.  DIAGNOSTIC IMAGING:  None for this visit.      ASSESSMENT AND PLAN:  Ms.. Mccollom is a pleasant 43 y.o. female with Stage IA left breast invasive ductal carcinoma, ER+/PR+/HER2-, diagnosed in 05/2023, treated with lumpectomy, and opted to forego radiation and antiestrogen therapy.  She presents to the Survivorship Clinic for our initial meeting and routine follow-up post-completion of treatment for breast cancer.    1. Stage IA right/left breast cancer:  Ms. Wuellner is continuing to recover from definitive treatment for breast cancer. She will follow-up with her medical oncologist, Dr.  Loretha in 04/2024 with history and physical exam per surveillance protocol.  Her mammogram is due 05/2024; orders placed today. She has extremely dense breasts and undergoes breast MRI every November.  Today, a comprehensive survivorship care plan and treatment summary  was reviewed with the patient today detailing her breast cancer diagnosis, treatment course, potential late/long-term effects of treatment, appropriate follow-up care with recommendations for the future, and patient education resources.  A copy of this summary, along with a letter will be sent to the patients primary care provider via mail/fax/In Basket message after todays visit.    2. Bone health:  She was given education on specific activities to promote bone health.  3.  Cancer screening:  Due to Ms. Elgin's history and her age, she should receive screening for skin cancers, colon cancer (at age 26), and gynecologic cancers.  The information and recommendations are listed on the patient's comprehensive care plan/treatment summary and were reviewed in detail with the patient.    4. Health maintenance and wellness promotion: Ms. Aristizabal was encouraged to consume 5-7 servings of fruits and vegetables per day. We reviewed the [employer] handout.  She was also encouraged to engage in moderate to vigorous exercise for 30 minutes per day most days of the week.  She was instructed to limit her alcohol consumption and continue to abstain from tobacco use.     5. Support services/counseling: It is not uncommon for this period of the patient's cancer care trajectory to be one of many emotions and stressors.   She was given information regarding our available services and encouraged to contact me with any questions or for help enrolling in any of our support group/programs.    Follow up instructions:    -Return to cancer center 04/2024 for f/u with Dr. Loretha  -Mammogram due in 05/2024 -Breast MRI 12/2024 -Guardant Reveal testing every 6 months, next due 05/2024 -She is welcome to return back to the Survivorship Clinic at any time; no additional follow-up needed at this time.  -Consider referral back to survivorship as a long-term survivor for continued surveillance   The patient was  provided an opportunity to ask questions and all were answered. The patient agreed with the plan and demonstrated an understanding of the instructions.   The patient was advised to call back or seek an in-person evaluation if the symptoms worsen or if the condition fails to improve as anticipated.   I provided 30 minutes of face-to-face video visit time during this encounter, and > 50% was spent counseling as documented under my assessment & plan.    Morna Kendall, NP 01/21/2024 10:52 AM Medical Oncology and Hematology [facility] [address] [place], [place] [address] Tel. [phone]    Fax. [phone]  *Total Encounter Time as defined by the Centers for Medicare and Medicaid Services includes, in addition to the face-to-face time of a patient visit (documented in the note above) non-face-to-face time: obtaining and reviewing outside history, ordering and reviewing medications, tests or procedures, care coordination (communications with other health care professionals or caregivers) and documentation in the medical record.     [1]  Allergies Allergen Reactions   Other Anaphylaxis    Honey bees

## 2024-03-02 ENCOUNTER — Ambulatory Visit: Payer: Self-pay | Attending: General Surgery

## 2024-03-09 ENCOUNTER — Ambulatory Visit: Payer: Self-pay

## 2024-03-12 ENCOUNTER — Other Ambulatory Visit: Payer: Self-pay

## 2024-03-12 NOTE — Progress Notes (Signed)
 Called patient due to her not responding to Guardant to set up her Guardant Reveal blood draw at home. Left a detailed message for the patient to call me back to try and get this set up for her.

## 2024-04-02 ENCOUNTER — Ambulatory Visit: Admitting: Internal Medicine

## 2024-04-08 ENCOUNTER — Inpatient Hospital Stay: Payer: Self-pay | Attending: Hematology and Oncology | Admitting: Hematology and Oncology

## 2024-05-19 ENCOUNTER — Encounter
